# Patient Record
Sex: Female | Born: 1955 | Race: White | Hispanic: No | Marital: Married
Health system: Southern US, Community
[De-identification: ages and names within clinical notes are randomized; demographics above are authoritative.]

## PROBLEM LIST (undated history)

## (undated) DIAGNOSIS — E1169 Type 2 diabetes mellitus with other specified complication: Secondary | ICD-10-CM

## (undated) DIAGNOSIS — E0590 Thyrotoxicosis, unspecified without thyrotoxic crisis or storm: Secondary | ICD-10-CM

## (undated) DIAGNOSIS — I82409 Acute embolism and thrombosis of unspecified deep veins of unspecified lower extremity: Secondary | ICD-10-CM

## (undated) DIAGNOSIS — C541 Malignant neoplasm of endometrium: Secondary | ICD-10-CM

---

## 1997-10-13 ENCOUNTER — Ambulatory Visit (HOSPITAL_COMMUNITY): Admission: RE | Admit: 1997-10-13 | Discharge: 1997-10-13 | Payer: Self-pay

## 2002-08-15 ENCOUNTER — Encounter: Payer: Self-pay | Admitting: Occupational Medicine

## 2002-08-15 ENCOUNTER — Encounter: Admission: RE | Admit: 2002-08-15 | Discharge: 2002-08-15 | Payer: Self-pay | Admitting: Occupational Medicine

## 2019-12-21 DIAGNOSIS — F319 Bipolar disorder, unspecified: Secondary | ICD-10-CM | POA: Diagnosis not present

## 2019-12-21 DIAGNOSIS — I700 Atherosclerosis of aorta: Secondary | ICD-10-CM | POA: Diagnosis not present

## 2019-12-21 DIAGNOSIS — E0590 Thyrotoxicosis, unspecified without thyrotoxic crisis or storm: Secondary | ICD-10-CM | POA: Diagnosis not present

## 2019-12-21 DIAGNOSIS — E1165 Type 2 diabetes mellitus with hyperglycemia: Secondary | ICD-10-CM | POA: Diagnosis not present

## 2019-12-21 DIAGNOSIS — R42 Dizziness and giddiness: Secondary | ICD-10-CM | POA: Diagnosis not present

## 2019-12-21 DIAGNOSIS — E785 Hyperlipidemia, unspecified: Secondary | ICD-10-CM | POA: Diagnosis not present

## 2019-12-21 DIAGNOSIS — Z6841 Body Mass Index (BMI) 40.0 and over, adult: Secondary | ICD-10-CM | POA: Diagnosis not present

## 2020-02-28 DIAGNOSIS — R42 Dizziness and giddiness: Secondary | ICD-10-CM | POA: Diagnosis not present

## 2020-02-28 DIAGNOSIS — Z6839 Body mass index (BMI) 39.0-39.9, adult: Secondary | ICD-10-CM | POA: Diagnosis not present

## 2020-02-28 DIAGNOSIS — N950 Postmenopausal bleeding: Secondary | ICD-10-CM | POA: Diagnosis not present

## 2020-03-08 DIAGNOSIS — I700 Atherosclerosis of aorta: Secondary | ICD-10-CM | POA: Diagnosis not present

## 2020-03-08 DIAGNOSIS — R102 Pelvic and perineal pain: Secondary | ICD-10-CM | POA: Diagnosis not present

## 2020-03-08 DIAGNOSIS — N852 Hypertrophy of uterus: Secondary | ICD-10-CM | POA: Diagnosis not present

## 2020-03-08 DIAGNOSIS — N938 Other specified abnormal uterine and vaginal bleeding: Secondary | ICD-10-CM | POA: Diagnosis not present

## 2020-03-08 DIAGNOSIS — N939 Abnormal uterine and vaginal bleeding, unspecified: Secondary | ICD-10-CM | POA: Diagnosis not present

## 2020-03-08 DIAGNOSIS — K760 Fatty (change of) liver, not elsewhere classified: Secondary | ICD-10-CM | POA: Diagnosis not present

## 2020-03-15 DIAGNOSIS — Z0000 Encounter for general adult medical examination without abnormal findings: Secondary | ICD-10-CM | POA: Diagnosis not present

## 2020-03-15 DIAGNOSIS — E669 Obesity, unspecified: Secondary | ICD-10-CM | POA: Diagnosis not present

## 2020-03-15 DIAGNOSIS — Z1331 Encounter for screening for depression: Secondary | ICD-10-CM | POA: Diagnosis not present

## 2020-03-15 DIAGNOSIS — E785 Hyperlipidemia, unspecified: Secondary | ICD-10-CM | POA: Diagnosis not present

## 2020-03-15 DIAGNOSIS — Z9181 History of falling: Secondary | ICD-10-CM | POA: Diagnosis not present

## 2020-03-21 DIAGNOSIS — N950 Postmenopausal bleeding: Secondary | ICD-10-CM | POA: Diagnosis not present

## 2020-04-09 DIAGNOSIS — N950 Postmenopausal bleeding: Secondary | ICD-10-CM | POA: Diagnosis not present

## 2020-04-23 DIAGNOSIS — F319 Bipolar disorder, unspecified: Secondary | ICD-10-CM | POA: Diagnosis not present

## 2020-04-23 DIAGNOSIS — E785 Hyperlipidemia, unspecified: Secondary | ICD-10-CM | POA: Diagnosis not present

## 2020-04-23 DIAGNOSIS — I479 Paroxysmal tachycardia, unspecified: Secondary | ICD-10-CM | POA: Diagnosis not present

## 2020-04-23 DIAGNOSIS — E0590 Thyrotoxicosis, unspecified without thyrotoxic crisis or storm: Secondary | ICD-10-CM | POA: Diagnosis not present

## 2020-04-23 DIAGNOSIS — I700 Atherosclerosis of aorta: Secondary | ICD-10-CM | POA: Diagnosis not present

## 2020-04-23 DIAGNOSIS — E1165 Type 2 diabetes mellitus with hyperglycemia: Secondary | ICD-10-CM | POA: Diagnosis not present

## 2020-05-23 DIAGNOSIS — N950 Postmenopausal bleeding: Secondary | ICD-10-CM | POA: Diagnosis not present

## 2020-06-07 DIAGNOSIS — I10 Essential (primary) hypertension: Secondary | ICD-10-CM | POA: Diagnosis not present

## 2020-06-07 DIAGNOSIS — E785 Hyperlipidemia, unspecified: Secondary | ICD-10-CM | POA: Diagnosis not present

## 2020-06-07 DIAGNOSIS — Z5331 Laparoscopic surgical procedure converted to open procedure: Secondary | ICD-10-CM | POA: Diagnosis not present

## 2020-06-07 DIAGNOSIS — N950 Postmenopausal bleeding: Secondary | ICD-10-CM | POA: Diagnosis not present

## 2020-06-07 DIAGNOSIS — I700 Atherosclerosis of aorta: Secondary | ICD-10-CM | POA: Diagnosis not present

## 2020-06-07 DIAGNOSIS — D62 Acute posthemorrhagic anemia: Secondary | ICD-10-CM | POA: Diagnosis not present

## 2020-06-07 DIAGNOSIS — F319 Bipolar disorder, unspecified: Secondary | ICD-10-CM | POA: Diagnosis not present

## 2020-06-07 DIAGNOSIS — E119 Type 2 diabetes mellitus without complications: Secondary | ICD-10-CM | POA: Diagnosis not present

## 2020-06-07 DIAGNOSIS — C541 Malignant neoplasm of endometrium: Secondary | ICD-10-CM | POA: Diagnosis not present

## 2020-06-07 DIAGNOSIS — N9961 Intraoperative hemorrhage and hematoma of a genitourinary system organ or structure complicating a genitourinary system procedure: Secondary | ICD-10-CM | POA: Diagnosis not present

## 2020-06-07 DIAGNOSIS — D259 Leiomyoma of uterus, unspecified: Secondary | ICD-10-CM | POA: Diagnosis not present

## 2020-06-26 DIAGNOSIS — Z7689 Persons encountering health services in other specified circumstances: Secondary | ICD-10-CM | POA: Diagnosis not present

## 2020-06-26 DIAGNOSIS — D62 Acute posthemorrhagic anemia: Secondary | ICD-10-CM | POA: Diagnosis not present

## 2020-06-26 DIAGNOSIS — Z6835 Body mass index (BMI) 35.0-35.9, adult: Secondary | ICD-10-CM | POA: Diagnosis not present

## 2020-06-26 DIAGNOSIS — C541 Malignant neoplasm of endometrium: Secondary | ICD-10-CM | POA: Diagnosis not present

## 2020-06-26 DIAGNOSIS — R42 Dizziness and giddiness: Secondary | ICD-10-CM | POA: Diagnosis not present

## 2020-06-26 DIAGNOSIS — Z79899 Other long term (current) drug therapy: Secondary | ICD-10-CM | POA: Diagnosis not present

## 2020-07-26 DIAGNOSIS — C541 Malignant neoplasm of endometrium: Secondary | ICD-10-CM | POA: Diagnosis not present

## 2020-07-26 DIAGNOSIS — I10 Essential (primary) hypertension: Secondary | ICD-10-CM | POA: Diagnosis not present

## 2020-07-26 DIAGNOSIS — Z6835 Body mass index (BMI) 35.0-35.9, adult: Secondary | ICD-10-CM | POA: Diagnosis not present

## 2020-07-26 DIAGNOSIS — R809 Proteinuria, unspecified: Secondary | ICD-10-CM | POA: Diagnosis not present

## 2020-07-26 DIAGNOSIS — Z23 Encounter for immunization: Secondary | ICD-10-CM | POA: Diagnosis not present

## 2020-07-26 DIAGNOSIS — E785 Hyperlipidemia, unspecified: Secondary | ICD-10-CM | POA: Diagnosis not present

## 2020-07-26 DIAGNOSIS — E1129 Type 2 diabetes mellitus with other diabetic kidney complication: Secondary | ICD-10-CM | POA: Diagnosis not present

## 2020-07-26 DIAGNOSIS — D649 Anemia, unspecified: Secondary | ICD-10-CM | POA: Diagnosis not present

## 2020-08-17 DIAGNOSIS — C541 Malignant neoplasm of endometrium: Secondary | ICD-10-CM | POA: Diagnosis not present

## 2020-08-17 DIAGNOSIS — Z888 Allergy status to other drugs, medicaments and biological substances status: Secondary | ICD-10-CM | POA: Diagnosis not present

## 2020-08-17 DIAGNOSIS — Z885 Allergy status to narcotic agent status: Secondary | ICD-10-CM | POA: Diagnosis not present

## 2020-08-23 DIAGNOSIS — C541 Malignant neoplasm of endometrium: Secondary | ICD-10-CM | POA: Diagnosis not present

## 2020-08-31 DIAGNOSIS — E041 Nontoxic single thyroid nodule: Secondary | ICD-10-CM | POA: Diagnosis not present

## 2020-08-31 DIAGNOSIS — C541 Malignant neoplasm of endometrium: Secondary | ICD-10-CM | POA: Diagnosis not present

## 2020-09-05 DIAGNOSIS — Z90710 Acquired absence of both cervix and uterus: Secondary | ICD-10-CM | POA: Diagnosis not present

## 2020-09-05 DIAGNOSIS — Z90722 Acquired absence of ovaries, bilateral: Secondary | ICD-10-CM | POA: Diagnosis not present

## 2020-09-05 DIAGNOSIS — C541 Malignant neoplasm of endometrium: Secondary | ICD-10-CM | POA: Diagnosis not present

## 2020-09-10 DIAGNOSIS — Z452 Encounter for adjustment and management of vascular access device: Secondary | ICD-10-CM | POA: Diagnosis not present

## 2020-09-10 DIAGNOSIS — C541 Malignant neoplasm of endometrium: Secondary | ICD-10-CM | POA: Diagnosis not present

## 2020-09-12 DIAGNOSIS — Z5111 Encounter for antineoplastic chemotherapy: Secondary | ICD-10-CM | POA: Diagnosis not present

## 2020-09-12 DIAGNOSIS — C541 Malignant neoplasm of endometrium: Secondary | ICD-10-CM | POA: Diagnosis not present

## 2020-09-19 DIAGNOSIS — C541 Malignant neoplasm of endometrium: Secondary | ICD-10-CM | POA: Diagnosis not present

## 2020-09-26 DIAGNOSIS — I749 Embolism and thrombosis of unspecified artery: Secondary | ICD-10-CM | POA: Diagnosis not present

## 2020-09-26 DIAGNOSIS — I748 Embolism and thrombosis of other arteries: Secondary | ICD-10-CM | POA: Diagnosis not present

## 2020-09-26 DIAGNOSIS — Z7901 Long term (current) use of anticoagulants: Secondary | ICD-10-CM | POA: Diagnosis not present

## 2020-09-26 DIAGNOSIS — R5383 Other fatigue: Secondary | ICD-10-CM | POA: Diagnosis not present

## 2020-09-26 DIAGNOSIS — C541 Malignant neoplasm of endometrium: Secondary | ICD-10-CM | POA: Diagnosis not present

## 2020-10-03 DIAGNOSIS — I82409 Acute embolism and thrombosis of unspecified deep veins of unspecified lower extremity: Secondary | ICD-10-CM | POA: Diagnosis not present

## 2020-10-03 DIAGNOSIS — Z86718 Personal history of other venous thrombosis and embolism: Secondary | ICD-10-CM | POA: Diagnosis not present

## 2020-10-03 DIAGNOSIS — Z5111 Encounter for antineoplastic chemotherapy: Secondary | ICD-10-CM | POA: Diagnosis not present

## 2020-10-03 DIAGNOSIS — Z79899 Other long term (current) drug therapy: Secondary | ICD-10-CM | POA: Diagnosis not present

## 2020-10-03 DIAGNOSIS — Z7901 Long term (current) use of anticoagulants: Secondary | ICD-10-CM | POA: Diagnosis not present

## 2020-10-03 DIAGNOSIS — C541 Malignant neoplasm of endometrium: Secondary | ICD-10-CM | POA: Diagnosis not present

## 2020-10-24 DIAGNOSIS — Z79899 Other long term (current) drug therapy: Secondary | ICD-10-CM | POA: Diagnosis not present

## 2020-10-24 DIAGNOSIS — C541 Malignant neoplasm of endometrium: Secondary | ICD-10-CM | POA: Diagnosis not present

## 2020-10-24 DIAGNOSIS — I82409 Acute embolism and thrombosis of unspecified deep veins of unspecified lower extremity: Secondary | ICD-10-CM | POA: Diagnosis not present

## 2020-10-24 DIAGNOSIS — Z7901 Long term (current) use of anticoagulants: Secondary | ICD-10-CM | POA: Diagnosis not present

## 2020-10-24 DIAGNOSIS — Z5111 Encounter for antineoplastic chemotherapy: Secondary | ICD-10-CM | POA: Diagnosis not present

## 2020-11-14 DIAGNOSIS — C541 Malignant neoplasm of endometrium: Secondary | ICD-10-CM | POA: Diagnosis not present

## 2020-11-14 DIAGNOSIS — Z5111 Encounter for antineoplastic chemotherapy: Secondary | ICD-10-CM | POA: Diagnosis not present

## 2020-11-14 DIAGNOSIS — Z79899 Other long term (current) drug therapy: Secondary | ICD-10-CM | POA: Diagnosis not present

## 2020-11-14 DIAGNOSIS — Z7901 Long term (current) use of anticoagulants: Secondary | ICD-10-CM | POA: Diagnosis not present

## 2020-11-14 DIAGNOSIS — Z86718 Personal history of other venous thrombosis and embolism: Secondary | ICD-10-CM | POA: Diagnosis not present

## 2020-11-14 DIAGNOSIS — N950 Postmenopausal bleeding: Secondary | ICD-10-CM | POA: Diagnosis not present

## 2020-11-21 DIAGNOSIS — C541 Malignant neoplasm of endometrium: Secondary | ICD-10-CM | POA: Diagnosis not present

## 2020-11-21 DIAGNOSIS — R5383 Other fatigue: Secondary | ICD-10-CM | POA: Diagnosis not present

## 2020-11-21 DIAGNOSIS — R42 Dizziness and giddiness: Secondary | ICD-10-CM | POA: Diagnosis not present

## 2020-11-21 DIAGNOSIS — R55 Syncope and collapse: Secondary | ICD-10-CM | POA: Diagnosis not present

## 2020-11-21 DIAGNOSIS — D709 Neutropenia, unspecified: Secondary | ICD-10-CM | POA: Diagnosis not present

## 2020-11-21 DIAGNOSIS — R112 Nausea with vomiting, unspecified: Secondary | ICD-10-CM | POA: Diagnosis not present

## 2020-11-27 DIAGNOSIS — E785 Hyperlipidemia, unspecified: Secondary | ICD-10-CM | POA: Diagnosis not present

## 2020-11-27 DIAGNOSIS — E1165 Type 2 diabetes mellitus with hyperglycemia: Secondary | ICD-10-CM | POA: Diagnosis not present

## 2020-11-27 DIAGNOSIS — Z6835 Body mass index (BMI) 35.0-35.9, adult: Secondary | ICD-10-CM | POA: Diagnosis not present

## 2020-11-27 DIAGNOSIS — I10 Essential (primary) hypertension: Secondary | ICD-10-CM | POA: Diagnosis not present

## 2020-11-27 DIAGNOSIS — E0590 Thyrotoxicosis, unspecified without thyrotoxic crisis or storm: Secondary | ICD-10-CM | POA: Diagnosis not present

## 2020-11-27 DIAGNOSIS — Z23 Encounter for immunization: Secondary | ICD-10-CM | POA: Diagnosis not present

## 2020-11-30 DIAGNOSIS — Z7901 Long term (current) use of anticoagulants: Secondary | ICD-10-CM | POA: Diagnosis not present

## 2020-11-30 DIAGNOSIS — G629 Polyneuropathy, unspecified: Secondary | ICD-10-CM | POA: Diagnosis not present

## 2020-11-30 DIAGNOSIS — C541 Malignant neoplasm of endometrium: Secondary | ICD-10-CM | POA: Diagnosis not present

## 2020-11-30 DIAGNOSIS — I82409 Acute embolism and thrombosis of unspecified deep veins of unspecified lower extremity: Secondary | ICD-10-CM | POA: Diagnosis not present

## 2020-12-05 DIAGNOSIS — C541 Malignant neoplasm of endometrium: Secondary | ICD-10-CM | POA: Diagnosis not present

## 2020-12-05 DIAGNOSIS — Z5111 Encounter for antineoplastic chemotherapy: Secondary | ICD-10-CM | POA: Diagnosis not present

## 2020-12-26 DIAGNOSIS — C541 Malignant neoplasm of endometrium: Secondary | ICD-10-CM | POA: Diagnosis not present

## 2020-12-26 DIAGNOSIS — Z5111 Encounter for antineoplastic chemotherapy: Secondary | ICD-10-CM | POA: Diagnosis not present

## 2020-12-26 DIAGNOSIS — Z86718 Personal history of other venous thrombosis and embolism: Secondary | ICD-10-CM | POA: Diagnosis not present

## 2020-12-26 DIAGNOSIS — Z79899 Other long term (current) drug therapy: Secondary | ICD-10-CM | POA: Diagnosis not present

## 2020-12-26 DIAGNOSIS — G629 Polyneuropathy, unspecified: Secondary | ICD-10-CM | POA: Diagnosis not present

## 2020-12-26 DIAGNOSIS — Z7901 Long term (current) use of anticoagulants: Secondary | ICD-10-CM | POA: Diagnosis not present

## 2021-01-24 ENCOUNTER — Emergency Department (HOSPITAL_COMMUNITY): Payer: Medicare HMO

## 2021-01-24 ENCOUNTER — Inpatient Hospital Stay (HOSPITAL_COMMUNITY): Payer: Medicare HMO

## 2021-01-24 ENCOUNTER — Inpatient Hospital Stay (HOSPITAL_COMMUNITY)
Admission: EM | Admit: 2021-01-24 | Discharge: 2021-01-27 | DRG: 682 | Disposition: A | Discharge status: Home / Self Care | Payer: Medicare HMO | Attending: Internal Medicine | Admitting: Internal Medicine

## 2021-01-24 ENCOUNTER — Encounter (HOSPITAL_COMMUNITY): Payer: Self-pay | Admitting: Internal Medicine

## 2021-01-24 ENCOUNTER — Other Ambulatory Visit: Payer: Self-pay

## 2021-01-24 DIAGNOSIS — N179 Acute kidney failure, unspecified: Secondary | ICD-10-CM | POA: Diagnosis not present

## 2021-01-24 DIAGNOSIS — F419 Anxiety disorder, unspecified: Secondary | ICD-10-CM | POA: Diagnosis present

## 2021-01-24 DIAGNOSIS — Z20822 Contact with and (suspected) exposure to covid-19: Secondary | ICD-10-CM | POA: Diagnosis present

## 2021-01-24 DIAGNOSIS — E119 Type 2 diabetes mellitus without complications: Secondary | ICD-10-CM | POA: Diagnosis present

## 2021-01-24 DIAGNOSIS — Z86718 Personal history of other venous thrombosis and embolism: Secondary | ICD-10-CM | POA: Diagnosis not present

## 2021-01-24 DIAGNOSIS — D539 Nutritional anemia, unspecified: Secondary | ICD-10-CM | POA: Diagnosis not present

## 2021-01-24 DIAGNOSIS — E0590 Thyrotoxicosis, unspecified without thyrotoxic crisis or storm: Secondary | ICD-10-CM | POA: Diagnosis present

## 2021-01-24 DIAGNOSIS — E860 Dehydration: Secondary | ICD-10-CM | POA: Diagnosis present

## 2021-01-24 DIAGNOSIS — R9431 Abnormal electrocardiogram [ECG] [EKG]: Secondary | ICD-10-CM | POA: Diagnosis not present

## 2021-01-24 DIAGNOSIS — R410 Disorientation, unspecified: Secondary | ICD-10-CM | POA: Diagnosis not present

## 2021-01-24 DIAGNOSIS — R0602 Shortness of breath: Secondary | ICD-10-CM | POA: Diagnosis not present

## 2021-01-24 DIAGNOSIS — R112 Nausea with vomiting, unspecified: Secondary | ICD-10-CM | POA: Diagnosis not present

## 2021-01-24 DIAGNOSIS — Z7901 Long term (current) use of anticoagulants: Secondary | ICD-10-CM

## 2021-01-24 DIAGNOSIS — Z9221 Personal history of antineoplastic chemotherapy: Secondary | ICD-10-CM

## 2021-01-24 DIAGNOSIS — E875 Hyperkalemia: Secondary | ICD-10-CM

## 2021-01-24 DIAGNOSIS — Z888 Allergy status to other drugs, medicaments and biological substances status: Secondary | ICD-10-CM

## 2021-01-24 DIAGNOSIS — Z6836 Body mass index (BMI) 36.0-36.9, adult: Secondary | ICD-10-CM

## 2021-01-24 DIAGNOSIS — R58 Hemorrhage, not elsewhere classified: Secondary | ICD-10-CM | POA: Diagnosis not present

## 2021-01-24 DIAGNOSIS — E669 Obesity, unspecified: Secondary | ICD-10-CM | POA: Diagnosis present

## 2021-01-24 DIAGNOSIS — K5909 Other constipation: Secondary | ICD-10-CM | POA: Diagnosis present

## 2021-01-24 DIAGNOSIS — I9589 Other hypotension: Secondary | ICD-10-CM | POA: Diagnosis not present

## 2021-01-24 DIAGNOSIS — G9341 Metabolic encephalopathy: Secondary | ICD-10-CM | POA: Diagnosis not present

## 2021-01-24 DIAGNOSIS — R1111 Vomiting without nausea: Secondary | ICD-10-CM | POA: Diagnosis not present

## 2021-01-24 DIAGNOSIS — R3129 Other microscopic hematuria: Secondary | ICD-10-CM | POA: Diagnosis present

## 2021-01-24 DIAGNOSIS — Z91018 Allergy to other foods: Secondary | ICD-10-CM

## 2021-01-24 DIAGNOSIS — N1330 Unspecified hydronephrosis: Secondary | ICD-10-CM | POA: Diagnosis not present

## 2021-01-24 DIAGNOSIS — F32A Depression, unspecified: Secondary | ICD-10-CM | POA: Diagnosis present

## 2021-01-24 DIAGNOSIS — D849 Immunodeficiency, unspecified: Secondary | ICD-10-CM | POA: Diagnosis not present

## 2021-01-24 DIAGNOSIS — I959 Hypotension, unspecified: Secondary | ICD-10-CM

## 2021-01-24 DIAGNOSIS — R0902 Hypoxemia: Secondary | ICD-10-CM | POA: Diagnosis not present

## 2021-01-24 DIAGNOSIS — D696 Thrombocytopenia, unspecified: Secondary | ICD-10-CM | POA: Diagnosis not present

## 2021-01-24 DIAGNOSIS — D638 Anemia in other chronic diseases classified elsewhere: Secondary | ICD-10-CM | POA: Diagnosis not present

## 2021-01-24 DIAGNOSIS — E872 Acidosis, unspecified: Secondary | ICD-10-CM | POA: Diagnosis not present

## 2021-01-24 DIAGNOSIS — I2782 Chronic pulmonary embolism: Secondary | ICD-10-CM | POA: Diagnosis not present

## 2021-01-24 DIAGNOSIS — R7401 Elevation of levels of liver transaminase levels: Secondary | ICD-10-CM | POA: Diagnosis present

## 2021-01-24 DIAGNOSIS — E861 Hypovolemia: Secondary | ICD-10-CM | POA: Diagnosis present

## 2021-01-24 DIAGNOSIS — E118 Type 2 diabetes mellitus with unspecified complications: Secondary | ICD-10-CM

## 2021-01-24 DIAGNOSIS — E785 Hyperlipidemia, unspecified: Secondary | ICD-10-CM | POA: Diagnosis present

## 2021-01-24 DIAGNOSIS — C541 Malignant neoplasm of endometrium: Secondary | ICD-10-CM | POA: Diagnosis present

## 2021-01-24 DIAGNOSIS — T464X5A Adverse effect of angiotensin-converting-enzyme inhibitors, initial encounter: Secondary | ICD-10-CM | POA: Diagnosis present

## 2021-01-24 DIAGNOSIS — Z885 Allergy status to narcotic agent status: Secondary | ICD-10-CM

## 2021-01-24 DIAGNOSIS — Z7985 Long-term (current) use of injectable non-insulin antidiabetic drugs: Secondary | ICD-10-CM

## 2021-01-24 DIAGNOSIS — Z79899 Other long term (current) drug therapy: Secondary | ICD-10-CM

## 2021-01-24 HISTORY — DX: Malignant neoplasm of endometrium: C54.1

## 2021-01-24 HISTORY — DX: Type 2 diabetes mellitus with other specified complication: E11.69

## 2021-01-24 HISTORY — DX: Acute embolism and thrombosis of unspecified deep veins of unspecified lower extremity: I82.409

## 2021-01-24 HISTORY — DX: Thyrotoxicosis, unspecified without thyrotoxic crisis or storm: E05.90

## 2021-01-24 LAB — RENAL FUNCTION PANEL
Albumin: 3.3 g/dL — ABNORMAL LOW (ref 3.5–5.0)
Anion gap: 17 — ABNORMAL HIGH (ref 5–15)
BUN: 27 mg/dL — ABNORMAL HIGH (ref 8–23)
CO2: 20 mmol/L — ABNORMAL LOW (ref 22–32)
Calcium: 8.6 mg/dL — ABNORMAL LOW (ref 8.9–10.3)
Chloride: 105 mmol/L (ref 98–111)
Creatinine, Ser: 3.08 mg/dL — ABNORMAL HIGH (ref 0.44–1.00)
GFR, Estimated: 16 mL/min — ABNORMAL LOW (ref >60)
Glucose, Bld: 212 mg/dL — ABNORMAL HIGH (ref 70–99)
Phosphorus: 4.3 mg/dL (ref 2.5–4.6)
Potassium: 4.8 mmol/L (ref 3.5–5.1)
Sodium: 142 mmol/L (ref 135–145)

## 2021-01-24 LAB — RAPID URINE DRUG SCREEN, HOSP PERFORMED
Amphetamines: NOT DETECTED
Barbiturates: NOT DETECTED
Benzodiazepines: NOT DETECTED
Cocaine: NOT DETECTED
Opiates: NOT DETECTED
Tetrahydrocannabinol: NOT DETECTED

## 2021-01-24 LAB — URINALYSIS, ROUTINE W REFLEX MICROSCOPIC
Bilirubin Urine: NEGATIVE
Glucose, UA: 250 mg/dL — AB
Ketones, ur: NEGATIVE mg/dL
Nitrite: NEGATIVE
Protein, ur: 30 mg/dL — AB
Specific Gravity, Urine: 1.015 (ref 1.005–1.030)
pH: 7 (ref 5.0–8.0)

## 2021-01-24 LAB — CBC WITH DIFFERENTIAL/PLATELET
Abs Immature Granulocytes: 0.06 10*3/uL (ref 0.00–0.07)
Basophils Absolute: 0 10*3/uL (ref 0.0–0.1)
Basophils Relative: 0 %
Eosinophils Absolute: 0 10*3/uL (ref 0.0–0.5)
Eosinophils Relative: 0 %
HCT: 24.5 % — ABNORMAL LOW (ref 36.0–46.0)
Hemoglobin: 7.6 g/dL — ABNORMAL LOW (ref 12.0–15.0)
Immature Granulocytes: 1 %
Lymphocytes Relative: 35 %
Lymphs Abs: 3.4 10*3/uL (ref 0.7–4.0)
MCH: 38.6 pg — ABNORMAL HIGH (ref 26.0–34.0)
MCHC: 31 g/dL (ref 30.0–36.0)
MCV: 124.4 fL — ABNORMAL HIGH (ref 80.0–100.0)
Monocytes Absolute: 0.9 10*3/uL (ref 0.1–1.0)
Monocytes Relative: 9 %
Neutro Abs: 5.2 10*3/uL (ref 1.7–7.7)
Neutrophils Relative %: 55 %
Platelets: 155 10*3/uL (ref 150–400)
RBC: 1.97 MIL/uL — ABNORMAL LOW (ref 3.87–5.11)
RDW: 21.9 % — ABNORMAL HIGH (ref 11.5–15.5)
WBC: 9.6 10*3/uL (ref 4.0–10.5)
nRBC: 0.5 % — ABNORMAL HIGH (ref 0.0–0.2)

## 2021-01-24 LAB — PROTIME-INR
INR: 1.4 — ABNORMAL HIGH (ref 0.8–1.2)
Prothrombin Time: 17 seconds — ABNORMAL HIGH (ref 11.4–15.2)

## 2021-01-24 LAB — TROPONIN I (HIGH SENSITIVITY)
Troponin I (High Sensitivity): 11 ng/L (ref <18)
Troponin I (High Sensitivity): 14 ng/L (ref <18)

## 2021-01-24 LAB — LACTIC ACID, PLASMA
Lactic Acid, Venous: 5.5 mmol/L (ref 0.5–1.9)
Lactic Acid, Venous: 7 mmol/L (ref 0.5–1.9)
Lactic Acid, Venous: 3.7 mmol/L (ref 0.5–1.9)
Lactic Acid, Venous: 3.3 mmol/L (ref 0.5–1.9)

## 2021-01-24 LAB — AMMONIA: Ammonia: 12 umol/L (ref 9–35)

## 2021-01-24 LAB — COMPREHENSIVE METABOLIC PANEL
ALT: 21 U/L (ref 0–44)
AST: 42 U/L — ABNORMAL HIGH (ref 15–41)
Albumin: 3.6 g/dL (ref 3.5–5.0)
Alkaline Phosphatase: 54 U/L (ref 38–126)
Anion gap: 17 — ABNORMAL HIGH (ref 5–15)
BUN: 30 mg/dL — ABNORMAL HIGH (ref 8–23)
CO2: 20 mmol/L — ABNORMAL LOW (ref 22–32)
Calcium: 9.3 mg/dL (ref 8.9–10.3)
Chloride: 102 mmol/L (ref 98–111)
Creatinine, Ser: 3.55 mg/dL — ABNORMAL HIGH (ref 0.44–1.00)
GFR, Estimated: 14 mL/min — ABNORMAL LOW (ref >60)
Glucose, Bld: 182 mg/dL — ABNORMAL HIGH (ref 70–99)
Potassium: 7 mmol/L (ref 3.5–5.1)
Sodium: 139 mmol/L (ref 135–145)
Total Bilirubin: 0.5 mg/dL (ref 0.3–1.2)
Total Protein: 6.8 g/dL (ref 6.5–8.1)

## 2021-01-24 LAB — RESP PANEL BY RT-PCR (FLU A&B, COVID) ARPGX2
Influenza A by PCR: NEGATIVE
Influenza B by PCR: NEGATIVE
SARS Coronavirus 2 by RT PCR: NEGATIVE

## 2021-01-24 LAB — TSH: TSH: 8.388 u[IU]/mL — ABNORMAL HIGH (ref 0.350–4.500)

## 2021-01-24 LAB — URINALYSIS, MICROSCOPIC (REFLEX): RBC / HPF: >50 RBC/hpf (ref 0–5)

## 2021-01-24 LAB — LIPASE, BLOOD: Lipase: 29 U/L (ref 11–51)

## 2021-01-24 LAB — CBG MONITORING, ED
Glucose-Capillary: 197 mg/dL — ABNORMAL HIGH (ref 70–99)
Glucose-Capillary: 110 mg/dL — ABNORMAL HIGH (ref 70–99)
Glucose-Capillary: 122 mg/dL — ABNORMAL HIGH (ref 70–99)

## 2021-01-24 LAB — ETHANOL: Alcohol, Ethyl (B): <10 mg/dL (ref <10)

## 2021-01-24 LAB — ABO/RH: ABO/RH(D): A POS

## 2021-01-24 LAB — PREPARE RBC (CROSSMATCH)

## 2021-01-24 LAB — HEMOGLOBIN AND HEMATOCRIT, BLOOD
HCT: 20.4 % — ABNORMAL LOW (ref 36.0–46.0)
HCT: 23.9 % — ABNORMAL LOW (ref 36.0–46.0)
Hemoglobin: 6.7 g/dL — CL (ref 12.0–15.0)
Hemoglobin: 7.4 g/dL — ABNORMAL LOW (ref 12.0–15.0)

## 2021-01-24 LAB — T4, FREE: Free T4: 0.63 ng/dL (ref 0.61–1.12)

## 2021-01-24 LAB — MAGNESIUM: Magnesium: 1.9 mg/dL (ref 1.7–2.4)

## 2021-01-24 LAB — APTT: aPTT: 27 seconds (ref 24–36)

## 2021-01-24 LAB — URIC ACID: Uric Acid, Serum: 8 mg/dL — ABNORMAL HIGH (ref 2.5–7.1)

## 2021-01-24 MED ORDER — VANCOMYCIN HCL 1500 MG/300ML IV SOLN
1500.0000 mg | Freq: Once | INTRAVENOUS | Status: AC
  Administered 2021-01-24: 1500 mg via INTRAVENOUS
  Filled 2021-01-24: qty 300

## 2021-01-24 MED ORDER — DEXTROSE 50 % IV SOLN
1.0000 | Freq: Once | INTRAVENOUS | Status: AC
  Administered 2021-01-24: 50 mL via INTRAVENOUS
  Filled 2021-01-24: qty 50

## 2021-01-24 MED ORDER — SODIUM CHLORIDE 0.9 % IV SOLN
2.0000 g | Freq: Once | INTRAVENOUS | Status: AC
  Administered 2021-01-24: 2 g via INTRAVENOUS
  Filled 2021-01-24: qty 2

## 2021-01-24 MED ORDER — CHLORHEXIDINE GLUCONATE CLOTH 2 % EX PADS
6.0000 | MEDICATED_PAD | Freq: Every day | CUTANEOUS | Status: DC
  Administered 2021-01-24 – 2021-01-27 (×4): 6 via TOPICAL

## 2021-01-24 MED ORDER — SODIUM BICARBONATE 8.4 % IV SOLN
50.0000 meq | Freq: Once | INTRAVENOUS | Status: AC
  Administered 2021-01-24: 50 meq via INTRAVENOUS
  Filled 2021-01-24: qty 50

## 2021-01-24 MED ORDER — INSULIN ASPART 100 UNIT/ML IV SOLN
5.0000 [IU] | Freq: Once | INTRAVENOUS | Status: AC
  Administered 2021-01-24: 5 [IU] via INTRAVENOUS

## 2021-01-24 MED ORDER — ONDANSETRON HCL 4 MG/2ML IJ SOLN: 4.0000 mg | Freq: Four times a day (QID) | INTRAMUSCULAR | Status: DC | PRN

## 2021-01-24 MED ORDER — ALBUTEROL SULFATE (2.5 MG/3ML) 0.083% IN NEBU: 2.5000 mg | INHALATION_SOLUTION | Freq: Four times a day (QID) | RESPIRATORY_TRACT | Status: DC | PRN

## 2021-01-24 MED ORDER — DEXTROSE 10 % IV SOLN: Freq: Once | INTRAVENOUS | Status: AC

## 2021-01-24 MED ORDER — ACETAMINOPHEN 325 MG PO TABS: 650.0000 mg | ORAL_TABLET | Freq: Four times a day (QID) | ORAL | Status: DC | PRN

## 2021-01-24 MED ORDER — ONDANSETRON HCL 4 MG PO TABS: 4.0000 mg | ORAL_TABLET | Freq: Four times a day (QID) | ORAL | Status: DC | PRN

## 2021-01-24 MED ORDER — SODIUM CHLORIDE 0.9% IV SOLUTION: Freq: Once | INTRAVENOUS | Status: AC

## 2021-01-24 MED ORDER — HEPARIN (PORCINE) 25000 UT/250ML-% IV SOLN: 1200.0000 [IU]/h | INTRAVENOUS | Status: DC

## 2021-01-24 MED ORDER — SODIUM CHLORIDE 0.9% FLUSH
3.0000 mL | Freq: Two times a day (BID) | INTRAVENOUS | Status: DC
  Administered 2021-01-24 – 2021-01-27 (×7): 3 mL via INTRAVENOUS

## 2021-01-24 MED ORDER — SODIUM CHLORIDE 0.9 % IV BOLUS
1000.0000 mL | Freq: Once | INTRAVENOUS | Status: AC
  Administered 2021-01-24: 1000 mL via INTRAVENOUS

## 2021-01-24 MED ORDER — VANCOMYCIN VARIABLE DOSE PER UNSTABLE RENAL FUNCTION (PHARMACIST DOSING): Status: DC

## 2021-01-24 MED ORDER — ACETAMINOPHEN 650 MG RE SUPP: 650.0000 mg | Freq: Four times a day (QID) | RECTAL | Status: DC | PRN

## 2021-01-24 MED ORDER — FUROSEMIDE 10 MG/ML IJ SOLN
40.0000 mg | Freq: Once | INTRAMUSCULAR | Status: AC
  Administered 2021-01-24: 40 mg via INTRAVENOUS
  Filled 2021-01-24: qty 4

## 2021-01-24 MED ORDER — SODIUM CHLORIDE 0.9% FLUSH
10.0000 mL | Freq: Two times a day (BID) | INTRAVENOUS | Status: DC
  Administered 2021-01-24 – 2021-01-25 (×3): 10 mL

## 2021-01-24 MED ORDER — SODIUM CHLORIDE 0.9 % IV SOLN
2.0000 g | INTRAVENOUS | Status: DC
  Administered 2021-01-25: 2 g via INTRAVENOUS
  Filled 2021-01-24: qty 2

## 2021-01-24 MED ORDER — OLANZAPINE 2.5 MG PO TABS
2.5000 mg | ORAL_TABLET | Freq: Every day | ORAL | Status: DC
  Administered 2021-01-24 – 2021-01-26 (×3): 2.5 mg via ORAL
  Filled 2021-01-24 (×4): qty 1

## 2021-01-24 MED ORDER — SODIUM CHLORIDE 0.9% FLUSH: 10.0000 mL | INTRAVENOUS | Status: DC | PRN

## 2021-01-24 MED ORDER — ALBUTEROL SULFATE (2.5 MG/3ML) 0.083% IN NEBU
10.0000 mg | INHALATION_SOLUTION | Freq: Once | RESPIRATORY_TRACT | Status: AC
  Administered 2021-01-24: 10 mg via RESPIRATORY_TRACT
  Filled 2021-01-24: qty 12

## 2021-01-24 MED ORDER — LACTATED RINGERS IV SOLN: INTRAVENOUS | Status: DC

## 2021-01-24 MED ORDER — ONDANSETRON HCL 4 MG/2ML IJ SOLN
4.0000 mg | Freq: Once | INTRAMUSCULAR | Status: AC
  Administered 2021-01-24: 4 mg via INTRAVENOUS
  Filled 2021-01-24: qty 2

## 2021-01-24 MED ORDER — ROSUVASTATIN CALCIUM 5 MG PO TABS
30.0000 mg | ORAL_TABLET | Freq: Every evening | ORAL | Status: DC
  Administered 2021-01-24 – 2021-01-26 (×3): 30 mg via ORAL
  Filled 2021-01-24 (×3): qty 2

## 2021-01-24 MED ORDER — METRONIDAZOLE 500 MG/100ML IV SOLN
500.0000 mg | Freq: Two times a day (BID) | INTRAVENOUS | Status: DC
  Administered 2021-01-24 – 2021-01-25 (×3): 500 mg via INTRAVENOUS
  Filled 2021-01-24 (×3): qty 100

## 2021-01-24 MED ORDER — FLUOXETINE HCL 20 MG PO CAPS
40.0000 mg | ORAL_CAPSULE | Freq: Every day | ORAL | Status: DC
  Administered 2021-01-25 – 2021-01-27 (×3): 40 mg via ORAL
  Filled 2021-01-24 (×3): qty 2

## 2021-01-24 MED ORDER — SODIUM ZIRCONIUM CYCLOSILICATE 10 G PO PACK
10.0000 g | PACK | Freq: Once | ORAL | Status: AC
  Administered 2021-01-24: 10 g via ORAL
  Filled 2021-01-24: qty 1

## 2021-01-24 MED ORDER — DARIFENACIN HYDROBROMIDE ER 15 MG PO TB24
15.0000 mg | ORAL_TABLET | Freq: Every day | ORAL | Status: DC
  Administered 2021-01-25 – 2021-01-27 (×3): 15 mg via ORAL
  Filled 2021-01-24 (×4): qty 1

## 2021-01-24 MED ORDER — INSULIN ASPART 100 UNIT/ML IJ SOLN: 0.0000 [IU] | Freq: Three times a day (TID) | INTRAMUSCULAR | Status: DC

## 2021-01-24 MED ORDER — LACTATED RINGERS IV BOLUS
1000.0000 mL | Freq: Once | INTRAVENOUS | Status: AC
  Administered 2021-01-24: 1000 mL via INTRAVENOUS

## 2021-01-24 MED ORDER — CALCIUM GLUCONATE-NACL 1-0.675 GM/50ML-% IV SOLN
1.0000 g | Freq: Once | INTRAVENOUS | Status: AC
  Administered 2021-01-24: 1000 mg via INTRAVENOUS
  Filled 2021-01-24: qty 50

## 2021-01-24 MED ORDER — SODIUM CHLORIDE 0.9 % IV BOLUS
2000.0000 mL | Freq: Once | INTRAVENOUS | Status: AC
  Administered 2021-01-24: 2000 mL via INTRAVENOUS

## 2021-01-24 NOTE — ED Notes (Signed)
Call to husband Iona Beard to notify of bed available , no answer

## 2021-01-24 NOTE — ED Notes (Signed)
Admitting provider at bedside, initially gave verbal order to hold NS bolus due to stable b/p, upon next b/p measurement, lower reading again, verbal order to start NS bolus as previously ordered

## 2021-01-24 NOTE — Consult Note (Signed)
[facility] Nephrology Consultation Note  Requesting MD: Dr Nanda Quinton ([place]) Reason for consult: AKI, Hyperkalemia  HPI:  Becky Martin is a 66 y.o. female with past medical history of hypertension, DVT on Eliquis, endometrial cancer currently on chemotherapy presented with nausea vomiting and decreased oral intake seen as a consultation for the management of acute kidney injury and hyperkalemia. The patient follows at [facility] for further treatment of endometrial cancer.  She is currently on carboplatin and paclitaxel, last seen by oncology on 12/26/2020.  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  At [facility] the creatinine level was around 0.9-1.13 at baseline.  As per patient's husband, the patient was nauseated and started vomiting around 2 PM yesterday.  Associated with change in mental status and not eating.  Denies hematemesis. In the ER, the blood pressure dropped to 61/47 which was resuscitated with IV fluid.  Labs showed sodium 139, potassium 7, CO2 20, BUN 30, creatinine level 3.55, anion gap 17, lactic acid level 5.5, WBC 9.6, RBC 1.97, hemoglobin 7.6.  Influenza and COVID test negative.  The chest x-ray with no acute finding. She was treated with IV fluid already received around 2 L of IV bolus.  Blood pressure and mental status significantly improved.  For hyperkalemia the patient was treated medically with calcium gluconate, dextrose, insulin, Lasix, Lokelma, sodium bicarb and fluid. She is on Lasix and lisinopril at home. The bladder scan without urinary retention per nursing staff. The patient will be admitted for further evaluation.  She currently denies headache, dizziness, nausea, vomiting, chest pain or shortness of breath.  No urinary complaint.  Her husband at bedside in ER.  Creatinine, Ser  Date/Time Value Ref Range Status  01/24/2021 03:50 AM 3.55 (H) 0.44 - 1.00 mg/dL Final     PMHx: DVT,  hypertension, endometrial cancer  PSH.  History reviewed.  Family Hx: No family history of kidney disease. Social History:  has no history on file for tobacco use, alcohol use, and drug use.  Allergies:  Allergies  Allergen Reactions   Other Hives    Sausage   Atenolol Other (See Comments)    Syncope, bradycardia (occurred in the setting of AKI) Syncope, bradycardia (occurred in the setting of AKI)    Lithium Other (See Comments)    Interacts with other meds that she takes   Metformin Nausea Only   Codeine Itching and Rash   Diazepam Itching and Rash   Fexofenadine Itching and Rash    Medications: Prior to Admission medications   Medication Sig Start Date End Date Taking? Authorizing Provider  ELIQUIS 5 MG TABS tablet Take 5 mg by mouth 2 (two) times daily. 01/14/21  Yes [provider]  FLUoxetine (PROZAC) 40 MG capsule Take 40 mg by mouth daily. 12/22/20  Yes [provider]  furosemide (LASIX) 20 MG tablet Take 20 mg by mouth 2 (two) times daily. 01/16/21  Yes [provider]  ibuprofen (ADVIL) 800 MG tablet Take 800 mg by mouth every 6 (six) hours as needed for mild pain or moderate pain. 06/09/20  Yes [provider]  lisinopril (ZESTRIL) 5 MG tablet Take 5 mg by mouth daily. 01/16/21  Yes [provider]  meclizine (ANTIVERT) 25 MG tablet Take 50 mg by mouth 2 (two) times daily. 01/17/21  Yes [provider]  methimazole (TAPAZOLE) 10 MG tablet Take by mouth. 12/08/20  Yes [provider]  metoprolol succinate (TOPROL-XL) 25 MG 24  hr tablet Take 25 mg by mouth 2 (two) times daily. 11/28/20  Yes [provider]  OLANZapine (ZYPREXA) 2.5 MG tablet Take 2.5 mg by mouth at bedtime. 01/03/21  Yes [provider]  OZEMPIC, 1 MG/DOSE, 4 MG/3ML SOPN Inject 1 mg into the skin once a week. 12/05/20  Yes [provider]  prochlorperazine (COMPAZINE) 10 MG tablet Take 10 mg by mouth every 6 (six) hours as  needed for vomiting or nausea. 09/12/20  Yes [provider]  rosuvastatin (CRESTOR) 20 MG tablet Take by mouth. 12/03/20  Yes [provider]  solifenacin (VESICARE) 10 MG tablet Take 10 mg by mouth daily. 12/10/20  Yes [provider]    I have reviewed the patient's current medications.  Labs:  Results for orders placed or performed during the hospital encounter of 01/24/21 (from the past 48 hour(s))  Lactic acid, plasma     Status: Abnormal   Collection Time: 01/24/21  3:39 AM  Result Value Ref Range   Lactic Acid, Venous 5.5 (HH) 0.5 - 1.9 mmol/L    Comment: CRITICAL RESULT CALLED TO, READ BACK BY AND VERIFIED WITH: Mariel Craft 01/24/21 [place] Performed at [facility] [address], [place],  [address]   Resp Panel by RT-PCR (Flu A&B, Covid) Nasopharyngeal Swab     Status: None   Collection Time: 01/24/21  3:40 AM   Specimen: Nasopharyngeal Swab; Nasopharyngeal(NP) swabs in vial transport medium  Result Value Ref Range   SARS Coronavirus 2 by RT PCR NEGATIVE NEGATIVE    Comment: (NOTE) SARS-CoV-2 target nucleic acids are NOT DETECTED.  The SARS-CoV-2 RNA is generally detectable in upper respiratory specimens during the acute phase of infection. The lowest concentration of SARS-CoV-2 viral copies this assay can detect is 138 copies/mL. A negative result does not preclude SARS-Cov-2 infection and should not be used as the sole basis for treatment or other patient management decisions. A negative result may occur with  improper specimen collection/handling, submission of specimen other than nasopharyngeal swab, presence of viral mutation(s) within the areas targeted by this assay, and inadequate number of viral copies(<138 copies/mL). A negative result must be combined with clinical observations, patient history, and epidemiological information. The expected result is Negative.  Fact Sheet for Patients:   EntrepreneurPulse.com.au  Fact Sheet for Healthcare Providers:  IncredibleEmployment.be  This test is no t yet approved or cleared by the [place] FDA and  has been authorized for detection and/or diagnosis of SARS-CoV-2 by FDA under an Emergency Use Authorization (EUA). This EUA will remain  in effect (meaning this test can be used) for the duration of the COVID-19 declaration under Section 564(b)(1) of the Act, 21 U.S.C.section 360bbb-3(b)(1), unless the authorization is terminated  or revoked sooner.       Influenza A by PCR NEGATIVE NEGATIVE   Influenza B by PCR NEGATIVE NEGATIVE    Comment: (NOTE) The Xpert Xpress SARS-CoV-2/FLU/RSV plus assay is intended as an aid in the diagnosis of influenza from Nasopharyngeal swab specimens and should not be used as a sole basis for treatment. Nasal washings and aspirates are unacceptable for Xpert Xpress SARS-CoV-2/FLU/RSV testing.  Fact Sheet for Patients: EntrepreneurPulse.com.au  Fact Sheet for Healthcare Providers: IncredibleEmployment.be  This test is not yet approved or cleared by the [place] FDA and has been authorized for detection and/or diagnosis of SARS-CoV-2 by FDA under an Emergency Use Authorization (EUA). This EUA will remain in effect (meaning this test can be used) for  the duration of the COVID-19 declaration under Section 564(b)(1) of the Act, 21 U.S.C. section 360bbb-3(b)(1), unless the authorization is terminated or revoked.  Performed at [facility], [place] [address], [place], [place] [address]   Ethanol     Status: None   Collection Time: 01/24/21  3:40 AM  Result Value Ref Range   Alcohol, Ethyl (B) <10 <10 mg/dL    Comment: (NOTE) Lowest detectable limit for serum alcohol is 10 mg/dL.  For medical purposes only. Performed at [facility], [place] [address], [place], [place] [address]   Comprehensive  metabolic panel     Status: Abnormal   Collection Time: 01/24/21  3:50 AM  Result Value Ref Range   Sodium 139 135 - 145 mmol/L   Potassium 7.0 (HH) 3.5 - 5.1 mmol/L    Comment: CRITICAL RESULT CALLED TO, READ BACK BY AND VERIFIED WITH: SMITH L,RN 01/24/21 0446 WAYK    Chloride 102 98 - 111 mmol/L   CO2 20 (L) 22 - 32 mmol/L   Glucose, Bld 182 (H) 70 - 99 mg/dL    Comment: Glucose reference range applies only to samples taken after fasting for at least 8 hours.   BUN 30 (H) 8 - 23 mg/dL   Creatinine, Ser 3.55 (H) 0.44 - 1.00 mg/dL   Calcium 9.3 8.9 - 10.3 mg/dL   Total Protein 6.8 6.5 - 8.1 g/dL   Albumin 3.6 3.5 - 5.0 g/dL   AST 42 (H) 15 - 41 U/L   ALT 21 0 - 44 U/L   Alkaline Phosphatase 54 38 - 126 U/L   Total Bilirubin 0.5 0.3 - 1.2 mg/dL   GFR, Estimated 14 (L) >60 mL/min    Comment: (NOTE) Calculated using the CKD-EPI Creatinine Equation (2021)    Anion gap 17 (H) 5 - 15    Comment: Performed at [facility], [place] [address] [place], [place] [address]  Lipase, blood     Status: None   Collection Time: 01/24/21  3:50 AM  Result Value Ref Range   Lipase 29 11 - 51 U/L    Comment: Performed at [facility] [address], [place], [place] [address]  CBC with Differential     Status: Abnormal   Collection Time: 01/24/21  3:50 AM  Result Value Ref Range   WBC 9.6 4.0 - 10.5 K/uL   RBC 1.97 (L) 3.87 - 5.11 MIL/uL   Hemoglobin 7.6 (L) 12.0 - 15.0 g/dL   HCT 24.5 (L) 36.0 - 46.0 %   MCV 124.4 (H) 80.0 - 100.0 fL   MCH 38.6 (H) 26.0 - 34.0 pg   MCHC 31.0 30.0 - 36.0 g/dL   RDW 21.9 (H) 11.5 - 15.5 %   Platelets 155 150 - 400 K/uL   nRBC 0.5 (H) 0.0 - 0.2 %   Neutrophils Relative % 55 %   Neutro Abs 5.2 1.7 - 7.7 K/uL   Lymphocytes Relative 35 %   Lymphs Abs 3.4 0.7 - 4.0 K/uL   Monocytes Relative 9 %   Monocytes Absolute 0.9 0.1 - 1.0 K/uL   Eosinophils Relative 0 %   Eosinophils Absolute 0.0 0.0 - 0.5 K/uL   Basophils Relative 0 %   Basophils Absolute  0.0 0.0 - 0.1 K/uL   Immature Granulocytes 1 %   Abs Immature Granulocytes 0.06 0.00 - 0.07 K/uL    Comment: Performed at [facility], [address], [place], [place] [address]  Troponin I (High Sensitivity)     Status: None   Collection Time: 01/24/21  3:50 AM  Result Value Ref Range   Troponin I (High Sensitivity) 11 <18 ng/L    Comment: (NOTE) Elevated high sensitivity troponin I (hsTnI) values and significant  changes across serial measurements may suggest ACS but many other  chronic and acute conditions are known to elevate hsTnI results.  Refer to the Links section for chest pain algorithms and additional  guidance. Performed at [facility], [place] [address], [place], [place] [address]   Type and screen [place]     Status: None   Collection Time: 01/24/21  3:50 AM  Result Value Ref Range   ABO/RH(D) A POS    Antibody Screen NEG    Sample Expiration      01/27/2021,2359 Performed at [facility], [place] [address], [place], [place] [address]   Protime-INR     Status: Abnormal   Collection Time: 01/24/21  3:50 AM  Result Value Ref Range   Prothrombin Time 17.0 (H) 11.4 - 15.2 seconds   INR 1.4 (H) 0.8 - 1.2    Comment: (NOTE) INR goal varies based on device and disease states. Performed at [facility], [place] [address] [place], [place] [address]   Ammonia     Status: None   Collection Time: 01/24/21  3:54 AM  Result Value Ref Range   Ammonia 12 9 - 35 umol/L    Comment: Performed at [facility], [place] [address] [place], [place] [address]     ROS: As per H&P.  Rest of the systems reviewed and are negative.  Physical Exam: Vitals:   01/24/21 0443 01/24/21 0445  BP:    Pulse: 98   Resp: (!) 22   Temp:    SpO2: (S) (!) 86% (S) 94%     General exam: Appears calm and comfortable  Respiratory system: Clear to auscultation. Respiratory effort normal. No wheezing or crackle Cardiovascular system: S1 & S2 heard,  RRR.  No pedal edema. Gastrointestinal system: Abdomen is nondistended, soft and nontender. Normal bowel sounds heard. Central nervous system: Alert and oriented. No focal neurological deficits. Extremities: Symmetric 5 x 5 power. Skin: No rashes, lesions or ulcers Psychiatry: Alert awake and following commands.   Assessment/Plan:  #Acute kidney injury likely hemodynamically mediated in the setting of hypotension concomitant with use of lisinopril/Lasix: Presented with nausea vomiting and decreased oral intake.  She was hypotensive in ER which was improved with IV fluid resuscitation.  Baseline creatinine level is around 0.9-1.1.  Bladder scan rule out urinary retention. I will check UA, kidney ultrasound, She already received treatment of hyperkalemia. Repeat renal panel, strict ins and out, continue IV fluid. Hold lisinopril, Lasix. No need for dialysis at this time.  #Hyperkalemia in the setting of AKI and lisinopril use.  She received medical treatment in ER including IV fluid, insulin, dextrose, calcium, Lokelma, bicarbonate and Lasix.  Repeat lab.  Hold lisinopril.  #Hypotension presumably due to volume depletion concomitant with antihypertensives: Blood pressure improved after fluid resuscitation in ER.  Hold antihypertensives.  #Anion gap metabolic acidosis, lactic acidosis: Work-up for infection/sepsis per medical team as patient is immunocompromised.  Received fluid, monitor lab.  #Endometrial cancer currently on chemotherapy at [facility].  Discussed with the patient, patient's husband and ER staff's  Thank you for the consult.  We will follow with you.  Deysi Soldo Tanna Furry 01/24/2021, 6:17 AM  [facility].

## 2021-01-24 NOTE — ED Provider Notes (Signed)
Emergency Department Provider Note   I have reviewed the triage vital signs and the nursing notes.   HISTORY  Chief Complaint Emesis (Pt from home with bloody emesis and low BP)   HPI Becky Martin is a 66 y.o. female with past medical history of endometrial cancer and DVT on Eliquis presents to the emergency department with hypotension by EMS.  They were called to the scene with patient vomiting.  Question some report of hematemesis.  Patient is unable to provide a significant history and apparently the husband is in route.  EMS established an IV in her right foot and gave approximately 200 mL of fluid in route.   Level 5 caveat: AMS  The patient's husband arrived to the emergency department shortly after the patient.  He tells me that since around 2 PM today she has had nausea and vomiting multiple times.  He denies any blood in the emesis.  He is not aware of diarrhea.  He tells me that her altered mental status is mostly baseline for her since her chemotherapy.  He states that she is been unable to keep down solids or liquids due to vomiting.  Ultimately called EMS with increased weakness.   Past Medical History:  Diagnosis Date   Diabetes mellitus type 2 in obese ([place])    DVT (deep venous thrombosis) ([place])    Endometrial cancer ([place])    Hyperthyroidism     Review of Systems  Constitutional: No fever/chills Cardiovascular: Denies chest pain. Respiratory: Denies shortness of breath. Gastrointestinal: No abdominal pain.  Neurological: Negative for headaches.  Level 5 caveat: AMS ____________________________________________   PHYSICAL EXAM:  VITAL SIGNS: Vitals:   01/24/21 2141 01/25/21 0013  BP: (!) 100/58 106/64  Pulse: 85 93  Resp: 13 20  Temp: 98.4 F (36.9 C)   SpO2: 97% 96%    Constitutional: Alert but confused. Sitting up in bed and fully conversational but some word finding difficulty and mild confusion.  Eyes: Conjunctivae are normal.  Head:  Atraumatic. Nose: No congestion/rhinnorhea. Mouth/Throat: Mucous membranes are dry.  Neck: No stridor.  Cardiovascular: Normal rate, regular rhythm. Good peripheral circulation. Grossly normal heart sounds.   Respiratory: Normal respiratory effort.  No retractions. Lungs CTAB. Gastrointestinal: Soft and nontender. No distention.  Musculoskeletal: No lower extremity tenderness nor edema. No gross deformities of extremities. Neurologic:  Normal speech and language. No gross focal neurologic deficits are appreciated.  Skin:  Skin is warm, dry and intact. No rash noted.   ____________________________________________   LABS (all labs ordered are listed, but only abnormal results are displayed)  Labs Reviewed  COMPREHENSIVE METABOLIC PANEL - Abnormal; Notable for the following components:      Result Value   Potassium 7.0 (*)    CO2 20 (*)    Glucose, Bld 182 (*)    BUN 30 (*)    Creatinine, Ser 3.55 (*)    AST 42 (*)    GFR, Estimated 14 (*)    Anion gap 17 (*)    All other components within normal limits  LACTIC ACID, PLASMA - Abnormal; Notable for the following components:   Lactic Acid, Venous 5.5 (*)    All other components within normal limits  LACTIC ACID, PLASMA - Abnormal; Notable for the following components:   Lactic Acid, Venous 7.0 (*)    All other components within normal limits  CBC WITH DIFFERENTIAL/PLATELET - Abnormal; Notable for the following components:   RBC 1.97 (*)    Hemoglobin 7.6 (*)  HCT 24.5 (*)    MCV 124.4 (*)    MCH 38.6 (*)    RDW 21.9 (*)    nRBC 0.5 (*)    All other components within normal limits  PROTIME-INR - Abnormal; Notable for the following components:   Prothrombin Time 17.0 (*)    INR 1.4 (*)    All other components within normal limits  URINALYSIS, ROUTINE W REFLEX MICROSCOPIC - Abnormal; Notable for the following components:   Glucose, UA 250 (*)    Hgb urine dipstick LARGE (*)    Protein, ur 30 (*)    Leukocytes,Ua SMALL  (*)    All other components within normal limits  RENAL FUNCTION PANEL - Abnormal; Notable for the following components:   CO2 20 (*)    Glucose, Bld 212 (*)    BUN 27 (*)    Creatinine, Ser 3.08 (*)    Calcium 8.6 (*)    Albumin 3.3 (*)    GFR, Estimated 16 (*)    Anion gap 17 (*)    All other components within normal limits  URIC ACID - Abnormal; Notable for the following components:   Uric Acid, Serum 8.0 (*)    All other components within normal limits  HEMOGLOBIN AND HEMATOCRIT, BLOOD - Abnormal; Notable for the following components:   Hemoglobin 6.7 (*)    HCT 20.4 (*)    All other components within normal limits  TSH - Abnormal; Notable for the following components:   TSH 8.388 (*)    All other components within normal limits  URINALYSIS, MICROSCOPIC (REFLEX) - Abnormal; Notable for the following components:   Bacteria, UA FEW (*)    All other components within normal limits  HEMOGLOBIN AND HEMATOCRIT, BLOOD - Abnormal; Notable for the following components:   Hemoglobin 7.4 (*)    HCT 23.9 (*)    All other components within normal limits  LACTIC ACID, PLASMA - Abnormal; Notable for the following components:   Lactic Acid, Venous 3.7 (*)    All other components within normal limits  LACTIC ACID, PLASMA - Abnormal; Notable for the following components:   Lactic Acid, Venous 3.3 (*)    All other components within normal limits  CBG MONITORING, ED - Abnormal; Notable for the following components:   Glucose-Capillary 197 (*)    All other components within normal limits  CBG MONITORING, ED - Abnormal; Notable for the following components:   Glucose-Capillary 110 (*)    All other components within normal limits  CBG MONITORING, ED - Abnormal; Notable for the following components:   Glucose-Capillary 122 (*)    All other components within normal limits  RESP PANEL BY RT-PCR (FLU A&B, COVID) ARPGX2  CULTURE, BLOOD (ROUTINE X 2)  CULTURE, BLOOD (ROUTINE X 2)  URINE CULTURE   LIPASE, BLOOD  RAPID URINE DRUG SCREEN, HOSP PERFORMED  ETHANOL  AMMONIA  MAGNESIUM  T4, FREE  APTT  OCCULT BLOOD X 1 CARD TO LAB, STOOL  COMPREHENSIVE METABOLIC PANEL  HEPARIN LEVEL (UNFRACTIONATED)  APTT  CBC  TYPE AND SCREEN  ABO/RH  PREPARE RBC (CROSSMATCH)  TROPONIN I (HIGH SENSITIVITY)  TROPONIN I (HIGH SENSITIVITY)   ____________________________________________  EKG   EKG Interpretation  Date/Time:  Thursday January 24 2021 03:28:21 EST Ventricular Rate:  56 PR Interval:    QRS Duration: 117 QT Interval:  498 QTC Calculation: 481 R Axis:   -76 Text Interpretation: Junctional rhythm Nonspecific IVCD with LAD Low voltage, extremity and precordial leads No old  tracing to compare Confirmed by Nanda Quinton [phone]) on 01/24/2021 3:31:41 AM        ____________________________________________  RADIOLOGY  DG Chest Portable 1 View  Result Date: 01/24/2021 CLINICAL DATA:  Shortness of breath and emesis EXAM: PORTABLE CHEST 1 VIEW COMPARISON:  08/31/2020 chest CT FINDINGS: Artifact from EKG leads. Enlarged heart size.  Negative mediastinal contours. Artifact from EKG leads. Porta catheter with tip at the right atrium. There is no edema, consolidation, effusion, or pneumothorax. IMPRESSION: No evidence of acute disease. Electronically Signed   By: Jorje Guild M.D.   On: 01/24/2021 04:47    ____________________________________________   PROCEDURES  Procedure(s) performed:   Procedures  CRITICAL CARE Performed by: Margette Fast Total critical care time: 75 minutes Critical care time was exclusive of separately billable procedures and treating other patients. Critical care was necessary to treat or prevent imminent or life-threatening deterioration. Critical care was time spent personally by me on the following activities: development of treatment plan with patient and/or surrogate as well as nursing, discussions with consultants, evaluation of patient's  response to treatment, examination of patient, obtaining history from patient or surrogate, ordering and performing treatments and interventions, ordering and review of laboratory studies, ordering and review of radiographic studies, pulse oximetry and re-evaluation of patient's condition.  Nanda Quinton, MD Emergency Medicine  ____________________________________________   INITIAL IMPRESSION / ASSESSMENT AND PLAN / ED COURSE  Pertinent labs & imaging results that were available during my care of the patient were reviewed by me and considered in my medical decision making (see chart for details).   This patient is Presenting for Evaluation of Hypotension and AMS, which does require a range of treatment options, and is a complaint that involves a high risk of morbidity and mortality.  The Differential Diagnoses include sepsis, acute blood loss anemia from GI bleed, severe dehydration, metabolic encephalopathy.  Critical Interventions- IVF boluses and labs along with imaging.    Reassessment after intervention: BP improved. Patient remains altered. No focal neuro deficits.    I did Additional Historical Information from patient's husband, as the patient is altered/confused. He confirms history of non-bloody emesis starting today.   I decided to review pertinent External Data, and in summary patient followed primarily in the [facility] system. Her Oncologist is Dr. Wendee Beavers and her last dose of chemotherapy was 12/26/20 (carboplatin, AUC, and Paclitaxel).    Clinical Laboratory Tests Ordered, included CBC, CMP, Lactic Acid, Troponin, UDS, UA, INR, and Type/Screen. Patient found to be in renal failure, new from labs at [facility], along with severe hyperkalemia. EKG interpreted by me as above. Anemia noted as well although Hb > 7 for now. Will continue to follow. Lactic acidosis noted but suspect this is from acute renal failure rather than sepsis, although sepsis was considered.    Radiologic Tests Ordered, included CXR which I independently evaluated and agree with radiology interpretation. No acute disease.   Cardiac Monitor Tracing which shows sinus tachycardia with hypotension on arrival.    Reevaluation with update and discussion with patient and husband. Discussed renal failure labs and severe hyperkalemia. Anemia noted as well but no signs of active GI bleeding for now.   04:52 AM Patient appears to be in acute renal failure by labs with potassium of 7.0.  EKG shows junctional rhythm.  In comparison with her labs in care everywhere from mid December the patient typically has normal renal function.  No leukocytosis.  Lactic acid of 5.5 likely related to acute renal  failure rather than sepsis, although this was considered.  Troponin of 11.  Suspect renal failure from GI losses.   Consult complete with Nephrology, ICU, and Hospitalist   05:00 AM Spoke with Dr. Carolin Sicks with nephrology. Agrees with meds given thus far. He will evaluate.   ICU came to evaluate at bedside. They advise [facility] admit with improving BP after IVF. Patient looking improved with sBP now > 100 on multiple readings.   Discussed patient's case with [facility] to request admission. Patient and family (if present) updated with plan. Care transferred to [facility] service.  I reviewed all nursing notes, vitals, pertinent old records, EKGs, labs, imaging (as available).   Medical Decision Making: Summary:  Patient arrives with hypotension and AMS. Awake and alert but confused. Possible vomiting with GI bleeding second hard from EMS. Patient is on Eliquis. Will begin fluid resuscitation and f/u with husband for additional history. Septic Shock is a consideration but history less supportive of this. Last chemo was almost 1 month prior.   Disposition: Admit  ____________________________________________  FINAL CLINICAL IMPRESSION(S) / ED DIAGNOSES  Final diagnoses:  Hypotension due to hypovolemia  Nausea  and vomiting, unspecified vomiting type  Acute renal failure, unspecified acute renal failure type ([place])  Hyperkalemia     MEDICATIONS GIVEN DURING THIS VISIT:  Medications  lactated ringers infusion ( Intravenous New Bag/Given 01/24/21 1753)  sodium chloride flush (NS) 0.9 % injection 3 mL (3 mLs Intravenous Given 01/24/21 2142)  acetaminophen (TYLENOL) tablet 650 mg (has no administration in time range)    Or  acetaminophen (TYLENOL) suppository 650 mg (has no administration in time range)  albuterol (PROVENTIL) (2.5 MG/3ML) 0.083% nebulizer solution 2.5 mg (has no administration in time range)  ondansetron (ZOFRAN) tablet 4 mg (has no administration in time range)    Or  ondansetron (ZOFRAN) injection 4 mg (has no administration in time range)  OLANZapine (ZYPREXA) tablet 2.5 mg (2.5 mg Oral Given 01/24/21 2143)  FLUoxetine (PROZAC) capsule 40 mg (40 mg Oral Not Given 01/24/21 1154)  darifenacin (ENABLEX) 24 hr tablet 15 mg (15 mg Oral Not Given 01/24/21 1226)  insulin aspart (novoLOG) injection 0-6 Units (0 Units Subcutaneous Not Given 01/24/21 1644)  rosuvastatin (CRESTOR) tablet 30 mg (30 mg Oral Given 01/24/21 1810)  metroNIDAZOLE (FLAGYL) IVPB 500 mg (500 mg Intravenous New Bag/Given 01/24/21 2146)  ceFEPIme (MAXIPIME) 2 g in sodium chloride 0.9 % 100 mL IVPB (has no administration in time range)  vancomycin variable dose per unstable renal function (pharmacist dosing) (has no administration in time range)  sodium chloride flush (NS) 0.9 % injection 10-40 mL (10 mLs Intracatheter Given 01/24/21 2143)  sodium chloride flush (NS) 0.9 % injection 10-40 mL (has no administration in time range)  Chlorhexidine Gluconate Cloth 2 % PADS 6 each (6 each Topical Given 01/24/21 1748)  sodium chloride 0.9 % bolus 2,000 mL (0 mLs Intravenous Stopped 01/24/21 0553)  ondansetron (ZOFRAN) injection 4 mg (4 mg Intravenous Given 01/24/21 0349)  furosemide (LASIX) injection 40 mg (40 mg Intravenous Given  01/24/21 0540)  sodium zirconium cyclosilicate (LOKELMA) packet 10 g (10 g Oral Given 01/24/21 0538)  calcium gluconate 1 g/ 50 mL sodium chloride IVPB (0 mg Intravenous Stopped 01/24/21 1057)  albuterol (PROVENTIL) (2.5 MG/3ML) 0.083% nebulizer solution 10 mg (10 mg Nebulization Given 01/24/21 0600)  insulin aspart (novoLOG) injection 5 Units (5 Units Intravenous Given 01/24/21 0545)    And  dextrose 50 % solution 50 mL (50 mLs Intravenous Given 01/24/21 0553)  dextrose 10 % infusion (0 mLs Intravenous Stopped 01/24/21 1340)  sodium bicarbonate injection 50 mEq (50 mEq Intravenous Given 01/24/21 0547)  lactated ringers bolus 1,000 mL (0 mLs Intravenous Stopped 01/24/21 0745)  sodium chloride 0.9 % bolus 1,000 mL (0 mLs Intravenous Stopped 01/24/21 1123)  0.9 %  sodium chloride infusion (Manually program via Guardrails IV Fluids) (0 mLs Intravenous Stopped 01/24/21 1459)  ceFEPIme (MAXIPIME) 2 g in sodium chloride 0.9 % 100 mL IVPB (0 g Intravenous Stopped 01/24/21 1228)  vancomycin (VANCOREADY) IVPB 1500 mg/300 mL (0 mg Intravenous Stopped 01/24/21 1548)     Note:  This document was prepared using Dragon voice recognition software and may include unintentional dictation errors.  Nanda Quinton, MD, [facility] Emergency Medicine    Cailyn Houdek, [facility], MD 01/25/21 [phone]

## 2021-01-24 NOTE — ED Triage Notes (Signed)
Pts husband called EMS d/t bloody emesis. States pt has not been drinking and what little she does drink she vomits.  EMS reports SBP in 40s at pts home. Hx of cancer

## 2021-01-24 NOTE — Progress Notes (Signed)
PCCM Note  66 year old female with endometrial cancer s/p recent completion of chemotherapy in 12/2020 at [facility] and DVT on anticoagulation. She presents with one day history of nausea and vomiting. She was initially hypotensive with SBP in the 60s and given 2L IVF. Labs significant for renal failure with K 7.0 and LA 5.5. ED consulted Nephrology who recommended hyperkalemia protocol. Given lokelma. PCCM consulted to evaluate for ICU admission. On my exam patient with SBP in 100-130s and AAOx3.   Recommend additional 1L LR and repeating labs after hyperkalemia protocol. If improving, no indication to transfer to ICU at this time.  Please call for any clinical changes or worsening status  PCCM available as needed.  Rodman Pickle, M.D. [facility] Pulmonary/Critical Care Medicine 01/24/2021 6:22 AM   See Amion for personal pager For hours between 7 PM to 7 AM, please call [place] for urgent questions

## 2021-01-24 NOTE — Plan of Care (Signed)

## 2021-01-24 NOTE — ED Notes (Signed)
Becky Martin husband [phone] requesting an update

## 2021-01-24 NOTE — Progress Notes (Signed)
[facility] for Heparin drip Indication: pulmonary embolus  Allergies  Allergen Reactions   Other Hives    Sausage   Atenolol Other (See Comments)    Syncope, bradycardia (occurred in the setting of AKI) Syncope, bradycardia (occurred in the setting of AKI)    Lithium Other (See Comments)    Interacts with other meds that she takes   Metformin Nausea Only   Codeine Itching and Rash   Diazepam Itching and Rash   Fexofenadine Itching and Rash    Patient Measurements: Height: 5\' 5"  (165.1 cm) Weight: 97.5 kg (215 lb) IBW/kg (Calculated) : 57 Heparin Dosing Weight: 79 kg  Vital Signs: Temp: 98.4 F (36.9 C) (01/12 1105) Temp Source: Oral (01/12 1105) BP: 101/47 (01/12 1105) Pulse Rate: 95 (01/12 1105)  Labs: Recent Labs    01/24/21 0350 01/24/21 0650 01/24/21 1015  HGB 7.6*  --  6.7*  HCT 24.5*  --  20.4*  PLT 155  --   --   LABPROT 17.0*  --   --   INR 1.4*  --   --   CREATININE 3.55* 3.08*  --   TROPONINIHS 11 14  --     Estimated Creatinine Clearance: 20.8 mL/min (A) (by C-G formula based on SCr of 3.08 mg/dL (H)).   Medical History: Past Medical History:  Diagnosis Date   Diabetes mellitus type 2 in obese ([place])    DVT (deep venous thrombosis) ([facility])    Endometrial cancer ([facility])    Hyperthyroidism     Medications:  Infusions:   heparin     lactated ringers 125 mL/hr at 01/24/21 0744    Assessment:  66 y.o. female with medical history significant of hypertension, DVT/PE on Eliquis, endometrial adenocarcinoma s/p  chemotherapy, and hyperthyroidism presents with complaints of nausea and vomiting. Last dose of  Eliquis 1/11 PM per husband  Patient presented with bloody emesis on admission No current signs/symptoms of bleed  Goal of Therapy:  Heparin level 0.3-0.7 units/ml aPTT 66-102 seconds Monitor platelets by anticoagulation protocol: Yes   Plan:  Start heparin 1200 units/hr (12 ml/hr) with no bolus 2/2  recent apixaban use aPTT @1800  Daily aPTT, heparin level, and CBC Monitor closely for signs/symptoms of bleed  Thank you for allowing pharmacy to be a part of this patients care.  Donnald Garre, PharmD Clinical Pharmacist  Please check AMION for all [place] numbers After 10:00 PM, call [place] [phone]

## 2021-01-24 NOTE — ED Notes (Signed)
Pt more alert, states feeling much better after transfusion.

## 2021-01-24 NOTE — Progress Notes (Signed)
Pharmacy Antibiotic Note  Becky Martin is a 66 y.o. female admitted on 01/24/2021 with sepsis of unknown origin in an immunocompromised patient.  Pharmacy has been consulted for Vanco/cefepime dosing.  Scr 3.55>>3.08 (BL 0.9-1.1)  Plan: Vancomycin 1500mg  LD x1 followed by variable dosing  Cefepime 2gm q24hr Plan to schedule subsequent dosing based on renal function trend Will monitor for acute changes in renal function and adjust as needed F/u cultures results and de-escalate as appropriate  Height: 5\' 5"  (165.1 cm) Weight: 97.5 kg (215 lb) IBW/kg (Calculated) : 57  Temp (24hrs), Avg:98.5 F (36.9 C), Min:98.3 F (36.8 C), Max:98.7 F (37.1 C)  Recent Labs  Lab 01/24/21 0339 01/24/21 0350 01/24/21 0650  WBC  --  9.6  --   CREATININE  --  3.55* 3.08*  LATICACIDVEN 5.5*  --  7.0*    Estimated Creatinine Clearance: 20.8 mL/min (A) (by C-G formula based on SCr of 3.08 mg/dL (H)).    Allergies  Allergen Reactions   Other Hives    Sausage   Atenolol Other (See Comments)    Syncope, bradycardia (occurred in the setting of AKI) Syncope, bradycardia (occurred in the setting of AKI)    Lithium Other (See Comments)    Interacts with other meds that she takes   Metformin Nausea Only   Codeine Itching and Rash   Diazepam Itching and Rash   Fexofenadine Itching and Rash    Antimicrobials this admission: Vancomycin 1/12 >>  Cefepime 1/12 >>  Metronidazole 1/12 >>  Dose adjustments this admission: none  Microbiology results: 1/12 BCx: IP 1/12 UCx: IP   Thank you for allowing pharmacy to be a part of this patients care.  Donnald Garre, PharmD Clinical Pharmacist  Please check AMION for all [place] numbers After 10:00 PM, call [place] [phone]

## 2021-01-24 NOTE — Progress Notes (Signed)
Repeat blood work noted hyperkalemia resolved and provement and acute kidney injury.  Case discussed with patient's oncologist over the phone Dr. 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  Repeat blood count noted to be lower at 6.7.  Transfusion of 1 unit of packed red blood cells ordered.  Initial orders have been put in place to place on heparin drip, but subsequently were discontinued and will place on SCDs.  Patient was noted to have pink urine which could be the possible source of bleeding.  Check stool guaiac.  Repeat lactic acid was noted to be 7.  Continue IV fluids and empiric antibiotics given patient immunocompromise status.

## 2021-01-24 NOTE — H&P (Addendum)
History and Physical    CORTNY BAMBACH KYH:[id number] DOB: 01/18/1955 DOA: 01/24/2021  Referring MD/NP/PA: Mitzi Hansen, MD PCP: Leonides Sake, MD  Consultants: Pablo Ledger, MD and Dustin Folks, MD- hematology oncology Patient coming from: Home(lives with husband) via EMS  Chief Complaint: Nausea and vomiting  I have personally briefly reviewed patient's old medical records in [place]   HPI: CARREN Martin is a 66 y.o. female with medical history significant of hypertension, DVT/PE on Eliquis, endometrial adenocarcinoma s/p chemotherapy, and hyperthyroidism presents with complaints of nausea and vomiting.  History is obtained from the patient with the assistance of her husband over the phone.  She had completed her last of 6 chemotherapy infusion of carboplatin and paclitaxel on 12/26/2020.  Since patient had been receiving chemotherapy she reports that it had caused her to be nauseous and vomit.  Symptoms are usually improved with antiemetics.  However, yesterday around 2:30 PM she started being able to eat and drink.  Her husband states that she started throwing up and was unable to stop.  He had given her a nausea pill and waited 6 hours before giving her another.  She continued to have nausea and vomiting despite this.  Thereafter he noted that she seemed to be getting confused.  She did not know who or where she was, and was not making sense with what she was saying.  He noticed yesterday that her urine was bloody.  Patient recalls also having malaise, palpitations, and some difficulty breathing.  Denies having any significant cough, fever, chest pain, abdominal pain, or dysuria. He called EMS this morning as he seemed to be getting worse bring her to the hospital for further evaluation.   ED Course: Upon admission into the emergency department patient was noted to be afebrile, pulse 57-98, respiration 19-22, blood pressure as low as 61/47-114/77, and O2 saturations as low as  86% on room air with improvement on 2 L nasal cannula oxygen to 94%.  Labs significant for hemoglobin 7.6, MCV 124.4, MCH 38.6, potassium 7, CO2 20, BUN 30, creatinine 3.55, anion gap 17, AST 42, lactic acid 5.5, INR 1.4, and high-sensitivity troponin 11.  Chest x-ray did not note any acute abnormalities.  Renal ultrasound was pending.  Critical care has been consulted due to hypotension, but recommended continued IV fluid resuscitation.  Nephrology have been consulted due to hyperkalemia.  He had been ordered a total of 3 L of IV fluids, sodium bicarb 1 amp, insulin 5 units, and dextrose amp, Lasix 40 mg IV, calcium gluconate, Lokelma 10 g p.o., and albuterol.  Review of Systems  Constitutional:  Positive for malaise/fatigue. Negative for weight loss.  Eyes:  Negative for photophobia and pain.  Respiratory:  Positive for shortness of breath.   Cardiovascular:  Negative for chest pain and leg swelling.  Gastrointestinal:  Positive for nausea and vomiting. Negative for abdominal pain, blood in stool and diarrhea.  Genitourinary:  Negative for dysuria.  Neurological:  Positive for weakness. Negative for loss of consciousness.  Psychiatric/Behavioral:  Negative for substance abuse.    Past Medical History:  Diagnosis Date   Diabetes mellitus type 2 in obese (HCC)    DVT (deep venous thrombosis) (HCC)    Endometrial cancer (HCC)    Hyperthyroidism      Allergies  Allergen Reactions   Other Hives    Sausage   Atenolol Other (See Comments)    Syncope, bradycardia (occurred in the setting of AKI) Syncope, bradycardia (occurred in  the setting of AKI)    Lithium Other (See Comments)    Interacts with other meds that she takes   Metformin Nausea Only   Codeine Itching and Rash   Diazepam Itching and Rash   Fexofenadine Itching and Rash      Prior to Admission medications   Medication Sig Start Date End Date Taking? Authorizing Provider  ELIQUIS 5 MG TABS tablet Take 5 mg by mouth 2  (two) times daily. 01/14/21  Yes [provider]  FLUoxetine (PROZAC) 40 MG capsule Take 40 mg by mouth daily. 12/22/20  Yes [provider]  furosemide (LASIX) 20 MG tablet Take 20 mg by mouth 2 (two) times daily. 01/16/21  Yes [provider]  ibuprofen (ADVIL) 800 MG tablet Take 800 mg by mouth every 6 (six) hours as needed for mild pain or moderate pain. 06/09/20  Yes [provider]  lisinopril (ZESTRIL) 5 MG tablet Take 5 mg by mouth daily. 01/16/21  Yes [provider]  meclizine (ANTIVERT) 25 MG tablet Take 50 mg by mouth 2 (two) times daily. 01/17/21  Yes [provider]  methimazole (TAPAZOLE) 10 MG tablet Take by mouth. 12/08/20  Yes [provider]  metoprolol succinate (TOPROL-XL) 25 MG 24 hr tablet Take 25 mg by mouth 2 (two) times daily. 11/28/20  Yes [provider]  OLANZapine (ZYPREXA) 2.5 MG tablet Take 2.5 mg by mouth at bedtime. 01/03/21  Yes [provider]  OZEMPIC, 1 MG/DOSE, 4 MG/3ML SOPN Inject 1 mg into the skin once a week. 12/05/20  Yes [provider]  prochlorperazine (COMPAZINE) 10 MG tablet Take 10 mg by mouth every 6 (six) hours as needed for vomiting or nausea. 09/12/20  Yes [provider]  rosuvastatin (CRESTOR) 20 MG tablet Take by mouth. 12/03/20  Yes [provider]  solifenacin (VESICARE) 10 MG tablet Take 10 mg by mouth daily. 12/10/20  Yes [provider]    Physical Exam:  Constitutional: Chronically ill-appearing female Vitals:   01/24/21 0331 01/24/21 0340 01/24/21 0443 01/24/21 0445  BP: (!) 61/47 114/77    Pulse: (!) 57 (!) 54 98   Resp: 19 (!) 21 (!) 22   Temp: 98.7 F (37.1 C)     TempSrc: Oral     SpO2: 95% 95% (S) (!) 86% (S) 94%   Eyes: PERRL, lids and conjunctivae normal ENMT: Mucous membranes are dry. Posterior pharynx clear of any exudate or lesions.  Neck: normal, supple, no masses, no thyromegaly Respiratory: Normal  respiratory effort with mild crackles noted at the bases.  O2 saturation currently maintained on room air. Cardiovascular: Regular rate and rhythm, no murmurs / rubs / gallops. No extremity edema. Abdomen: no tenderness, no masses palpated. No hepatosplenomegaly. Bowel sounds positive.  Musculoskeletal: no clubbing / cyanosis. No joint deformity upper and lower extremities. Good ROM, no contractures. Normal muscle tone.  Skin: no rashes, lesions, ulcers. No induration.  Pallor present.   Neurologic: CN 2-12 grossly intact. Strength 4/5 in all extremities Psychiatric: Normal judgment and insight. Alert and oriented x 3. Normal mood.     Labs on Admission: I have personally reviewed following labs and imaging studies  CBC: Recent Labs  Lab 01/24/21 0350  WBC 9.6  NEUTROABS 5.2  HGB 7.6*  HCT 24.5*  MCV 124.4*  PLT 381   Basic Metabolic Panel: Recent Labs  Lab 01/24/21 0350  NA 139  K 7.0*  CL 102  CO2 20*  GLUCOSE 182*  BUN  30*  CREATININE 3.55*  CALCIUM 9.3   GFR: CrCl cannot be calculated (Unknown ideal weight.). Liver Function Tests: Recent Labs  Lab 01/24/21 0350  AST 42*  ALT 21  ALKPHOS 54  BILITOT 0.5  PROT 6.8  ALBUMIN 3.6   Recent Labs  Lab 01/24/21 0350  LIPASE 29   Recent Labs  Lab 01/24/21 0354  AMMONIA 12   Coagulation Profile: Recent Labs  Lab 01/24/21 0350  INR 1.4*   Cardiac Enzymes: No results for input(s): CKTOTAL, CKMB, CKMBINDEX, TROPONINI in the last 168 hours. BNP (last 3 results) No results for input(s): PROBNP in the last 8760 hours. HbA1C: No results for input(s): HGBA1C in the last 72 hours. CBG: Recent Labs  Lab 01/24/21 0657  GLUCAP 197*   Lipid Profile: No results for input(s): CHOL, HDL, LDLCALC, TRIG, CHOLHDL, LDLDIRECT in the last 72 hours. Thyroid Function Tests: No results for input(s): TSH, T4TOTAL, FREET4, T3FREE, THYROIDAB in the last 72 hours. Anemia Panel: No results for input(s): VITAMINB12,  FOLATE, FERRITIN, TIBC, IRON, RETICCTPCT in the last 72 hours. Urine analysis: No results found for: COLORURINE, APPEARANCEUR, LABSPEC, PHURINE, GLUCOSEU, HGBUR, BILIRUBINUR, KETONESUR, PROTEINUR, UROBILINOGEN, NITRITE, LEUKOCYTESUR Sepsis Labs: Recent Results (from the past 240 hour(s))  Resp Panel by RT-PCR (Flu A&B, Covid) Nasopharyngeal Swab     Status: None   Collection Time: 01/24/21  3:40 AM   Specimen: Nasopharyngeal Swab; Nasopharyngeal(NP) swabs in vial transport medium  Result Value Ref Range Status   SARS Coronavirus 2 by RT PCR NEGATIVE NEGATIVE Final    Comment: (NOTE) SARS-CoV-2 target nucleic acids are NOT DETECTED.  The SARS-CoV-2 RNA is generally detectable in upper respiratory specimens during the acute phase of infection. The lowest concentration of SARS-CoV-2 viral copies this assay can detect is 138 copies/mL. A negative result does not preclude SARS-Cov-2 infection and should not be used as the sole basis for treatment or other patient management decisions. A negative result may occur with  improper specimen collection/handling, submission of specimen other than nasopharyngeal swab, presence of viral mutation(s) within the areas targeted by this assay, and inadequate number of viral copies(<138 copies/mL). A negative result must be combined with clinical observations, patient history, and epidemiological information. The expected result is Negative.  Fact Sheet for Patients:  EntrepreneurPulse.com.au  Fact Sheet for Healthcare Providers:  IncredibleEmployment.be  This test is no t yet approved or cleared by the [place] FDA and  has been authorized for detection and/or diagnosis of SARS-CoV-2 by FDA under an Emergency Use Authorization (EUA). This EUA will remain  in effect (meaning this test can be used) for the duration of the COVID-19 declaration under Section 564(b)(1) of the Act, 21 U.S.C.section 360bbb-3(b)(1),  unless the authorization is terminated  or revoked sooner.       Influenza A by PCR NEGATIVE NEGATIVE Final   Influenza B by PCR NEGATIVE NEGATIVE Final    Comment: (NOTE) The Xpert Xpress SARS-CoV-2/FLU/RSV plus assay is intended as an aid in the diagnosis of influenza from Nasopharyngeal swab specimens and should not be used as a sole basis for treatment. Nasal washings and aspirates are unacceptable for Xpert Xpress SARS-CoV-2/FLU/RSV testing.  Fact Sheet for Patients: EntrepreneurPulse.com.au  Fact Sheet for Healthcare Providers: IncredibleEmployment.be  This test is not yet approved or cleared by the [place] FDA and has been authorized for detection and/or diagnosis of SARS-CoV-2 by FDA under an Emergency Use Authorization (EUA). This EUA will remain in effect (meaning this test can be used) for  the duration of the COVID-19 declaration under Section 564(b)(1) of the Act, 21 U.S.C. section 360bbb-3(b)(1), unless the authorization is terminated or revoked.  Performed at [facility], [place] [address], [place], [place] [address]      Radiological Exams on Admission: DG Chest Portable 1 View  Result Date: 01/24/2021 CLINICAL DATA:  Shortness of breath and emesis EXAM: PORTABLE CHEST 1 VIEW COMPARISON:  08/31/2020 chest CT FINDINGS: Artifact from EKG leads. Enlarged heart size.  Negative mediastinal contours. Artifact from EKG leads. Porta catheter with tip at the right atrium. There is no edema, consolidation, effusion, or pneumothorax. IMPRESSION: No evidence of acute disease. Electronically Signed   By: Jorje Guild M.D.   On: 01/24/2021 04:47    EKG: Independently reviewed.  Junctional rhythm at 56 bpm  Assessment/Plan Hyperkalemia: Patient presents with potassium elevated up to 7.  Contributing factors likely include medications of lisinopril.  Patient had been treated with IV fluids, calcium gluconate, albuterol, insulin,  dextrose, sodium bicarb, Lasix, and Lokelma. -Admit to a progressive bed -Serial monitoring of potassium levels -Continue lactated Ringer's 125 mL/h  Transient hypotension: Acute.  Patient initially noted to have blood pressure as low as 61/47.  Blood pressures improved with IV fluid boluses.  Home medication regimen includes furosemide 20 mg twice daily, lisinopril 5 mg daily, and metoprolol 25 mg twice daily. -Hold home blood pressure regimen -Bolus IV fluids as needed to maintain maps greater than 65  Acute renal failure: Patient presents with creatinine of 3.55 with BUN 31.  Creatinine previously had been 1.13 on 12/26/2020.  Given patient's reports of decreased p.o. intake with nausea and vomiting suspect prerenal cause in addition to medications like furosemide. -Strict intake and output -Check urinalysis once able -IV fluids as noted above -Appreciate nephrology consultative services, we will follow-up for any further recommendation  Macrocytic anemia/: Acute on chronic.  On admission hemoglobin 7.6 g/dL with MCV 124.4 and MCH 38.6.  Previously hemoglobin had been 8.7 g/dL on 12/14.  Patient was typed and screened for possible need of blood products.  Question possibility of acute blood loss versus possibly related to prior chemo. -Continue to monitor H&H  -Ordering transfusion of 1 unit of packed red blood cells as blood counts are possibly lower  Acute metabolic encephalopathy: Patient's husband noted the patient to be acutely altered and not making sense last night for which he called EMS.  At this time patient appears to be alert and oriented to person and place.  Suspect likely related to severe dehydration and hypotension. -Neurochecks -Continue to monitor and determine need of further evaluation  Hypoxia: Acute.  On physical exam patient lung sounds relatively clear with crackles noted at the bases.  Chest x-ray noted no acute abnormality.  O2 saturations reported to be as low as  86% on room air and had been placed on 2 L nasal cannula temporarily, but currently maintaining O2 saturations on room air.  Of note she has a history of chronic pulmonary emboli, but has been on Eliquis. -Continuous pulse oximetry with nasal cannula oxygen maintain O2 saturation greater than 92% -Check phosphorus levels -May warrant further investigation if symptoms recur  Metabolic acidosis with anion gap  lactic acidosis: Acute.  On admission patient was noted to have CO2 of 20 with anion gap 17 and lactic acid 5.5.  Suspect lactic acidosis secondary to severe dehydration, but question possibility of underlying infection.  So far chest x-ray negative.  Blood cultures have been obtained. -Follow-up blood  cultures -Follow-up urinalysis -Trend lactic acid levels -Started on empiric antibiotics of vancomycin, cefepime, and metronidazole as patient is immunocompromised  Diabetes mellitus type 2: On admission CBG was in the 200s.  Home medication regimen includes Ozempic 1 mg q. weekly. -Hypoglycemic protocols -CBGs before every meal with very sensitive SSI  Endometrial adenocarcinoma on chemotherapy: Patient followed at oncology at [facility].   -Will likely need to continue outpatient follow-up with oncology in outpatient setting.  Hyperthyroidism: Chronic.  Patient on methimazole 10 mg daily in the outpatient setting. -Check TSH and free T4 -Resume metamizole when medically appropriate  Transaminitis: Acute.  AST 42, but previously had been within normal limits.  Suspect secondary to acute hypotension. -Continue to monitor  Anxiety/depression -Continue Prozac and olanzapine  Hyperlipidemia -Continue Crestor  History of DVT/PE on chronic anticoagulation: Patient has been on Eliquis and had been noted to have concern for chronic pulmonary emboli and from CT scan of the abdomen and pelvis with contrast from 08/31/2020. -Held Eliquis -Changed to heparin per pharmacy until able to rule  out possibility of bleeding   DVT prophylaxis: Heparin Code Status: Full Family Communication: Husband updated over the phone Disposition Plan: Hopefully discharge home once medically stable Consults called: PCCM, nephrology Admission status: Inpatient require more than 2 midnight stay in the setting of acute renal failure need of aggressive IV hydration  Norval Morton MD [facility]   If 7PM-7AM, please contact night-coverage   01/24/2021, 7:24 AM

## 2021-01-24 NOTE — ED Notes (Signed)
Pt to u/s

## 2021-01-25 LAB — APTT
aPTT: 28 seconds (ref 24–36)
aPTT: 80 seconds — ABNORMAL HIGH (ref 24–36)

## 2021-01-25 LAB — HEPARIN LEVEL (UNFRACTIONATED): Heparin Unfractionated: >1.1 IU/mL — ABNORMAL HIGH (ref 0.30–0.70)

## 2021-01-25 LAB — COMPREHENSIVE METABOLIC PANEL
ALT: 17 U/L (ref 0–44)
AST: 21 U/L (ref 15–41)
Albumin: 2.9 g/dL — ABNORMAL LOW (ref 3.5–5.0)
Alkaline Phosphatase: 46 U/L (ref 38–126)
Anion gap: 7 (ref 5–15)
BUN: 15 mg/dL (ref 8–23)
CO2: 22 mmol/L (ref 22–32)
Calcium: 8.9 mg/dL (ref 8.9–10.3)
Chloride: 112 mmol/L — ABNORMAL HIGH (ref 98–111)
Creatinine, Ser: 1.81 mg/dL — ABNORMAL HIGH (ref 0.44–1.00)
GFR, Estimated: 30 mL/min — ABNORMAL LOW (ref >60)
Glucose, Bld: 121 mg/dL — ABNORMAL HIGH (ref 70–99)
Potassium: 4.8 mmol/L (ref 3.5–5.1)
Sodium: 141 mmol/L (ref 135–145)
Total Bilirubin: 0.7 mg/dL (ref 0.3–1.2)
Total Protein: 5.4 g/dL — ABNORMAL LOW (ref 6.5–8.1)

## 2021-01-25 LAB — CBC
HCT: 22.2 % — ABNORMAL LOW (ref 36.0–46.0)
Hemoglobin: 7.4 g/dL — ABNORMAL LOW (ref 12.0–15.0)
MCH: 38.1 pg — ABNORMAL HIGH (ref 26.0–34.0)
MCHC: 33.3 g/dL (ref 30.0–36.0)
MCV: 114.4 fL — ABNORMAL HIGH (ref 80.0–100.0)
Platelets: 128 10*3/uL — ABNORMAL LOW (ref 150–400)
RBC: 1.94 MIL/uL — ABNORMAL LOW (ref 3.87–5.11)
RDW: 26.2 % — ABNORMAL HIGH (ref 11.5–15.5)
WBC: 7.2 10*3/uL (ref 4.0–10.5)
nRBC: 0.6 % — ABNORMAL HIGH (ref 0.0–0.2)

## 2021-01-25 LAB — GLUCOSE, CAPILLARY
Glucose-Capillary: 124 mg/dL — ABNORMAL HIGH (ref 70–99)
Glucose-Capillary: 115 mg/dL — ABNORMAL HIGH (ref 70–99)
Glucose-Capillary: 126 mg/dL — ABNORMAL HIGH (ref 70–99)
Glucose-Capillary: 119 mg/dL — ABNORMAL HIGH (ref 70–99)

## 2021-01-25 LAB — URINE CULTURE

## 2021-01-25 LAB — LACTIC ACID, PLASMA
Lactic Acid, Venous: 1.4 mmol/L (ref 0.5–1.9)
Lactic Acid, Venous: 2.2 mmol/L (ref 0.5–1.9)

## 2021-01-25 MED ORDER — LACTATED RINGERS IV SOLN: INTRAVENOUS | Status: DC

## 2021-01-25 MED ORDER — HEPARIN (PORCINE) 25000 UT/250ML-% IV SOLN
1300.0000 [IU]/h | INTRAVENOUS | Status: DC
  Administered 2021-01-25: 11:00:00 1200 [IU]/h via INTRAVENOUS
  Administered 2021-01-26: 1300 [IU]/h via INTRAVENOUS
  Filled 2021-01-25 (×2): qty 250

## 2021-01-25 NOTE — Progress Notes (Signed)
[facility] Progress Note    Assessment/Plan:   #Acute kidney injury likely hemodynamically mediated in the setting of hypotension concomitant with use of lisinopril/Lasix: Presented with nausea vomiting and decreased oral intake.  She was hypotensive in ER which was improved with IV fluid resuscitation.  Baseline creatinine level is around 0.9-1.1.  Bladder scan rule out urinary retention. I will check UA, kidney ultrasound,--> negative for obstruction She already received treatment of hyperkalemia--> this is normalized. Repeat renal panel, strict ins and out, continue IV fluid. Hold lisinopril, Lasix. No need for dialysis at this time. - Cr has drastically improved with IVF resuscitation and treatment of sepsis - expect further improvement- will sign off.  Call with questions   #Hyperkalemia in the setting of AKI and lisinopril use.  She received medical treatment in ER including IV fluid, insulin, dextrose, calcium, Lokelma, bicarbonate and Lasix.  - repeat K is normalized   #Hypotension presumably due to volume depletion concomitant with antihypertensives: Blood pressure improved after fluid resuscitation in ER.  Got antibiotics for presumed sepsis   #Anion gap metabolic acidosis, lactic acidosis: Work-up for infection/sepsis per medical team as patient is immunocompromised.  Received fluid, monitor lab.   #Endometrial cancer currently on chemotherapy at [place].  # Chronic pulm embolus- on Eliquis as OP, hep gtt here  # Anemia- got 1 u pRBCs yesterday.    Subjective:    Cr much improved and pt feeling better.  Husband and son at the bedside   Objective:   BP (!) 131/53 (BP Location: Right Arm)    Pulse 86    Temp 98.5 F (36.9 C) (Oral)    Resp 20    Ht 5\' 5"  (1.651 m)    Wt 98.8 kg    SpO2 96%    BMI 36.25 kg/m   Intake/Output Summary (Last 24 hours) at 01/25/2021 1540 Last data filed at 01/25/2021 1430 Gross per 24 hour  Intake 123 ml  Output 1070  ml  Net -947 ml   Weight change:   Physical Exam: Gen:NAD, + alopecia, sitting in bed CVS: RRR Resp: clear Abd: soft, nontender Ext: no LE edema  Imaging: US RENAL  Result Date: 01/24/2021 CLINICAL DATA:  Acute kidney injury EXAM: RENAL / URINARY TRACT ULTRASOUND COMPLETE COMPARISON:  None. FINDINGS: Right Kidney: Renal measurements: 9.8 x 5.3 x 5.1 cm = volume: 139.9 mL. Echogenicity within normal limits. No mass. Mild hydronephrosis visualized. Left Kidney: Renal measurements: 10.2 x 5.9 x 4.6 cm = volume: 143.8 mL. Echogenicity within normal limits. No mass or hydronephrosis visualized. Bladder: Appears normal for degree of bladder distention. Other: None. IMPRESSION: Mild right hydronephrosis. Electronically Signed   By: Macy Mis M.D.   On: 01/24/2021 10:28   DG Chest Portable 1 View  Result Date: 01/24/2021 CLINICAL DATA:  Shortness of breath and emesis EXAM: PORTABLE CHEST 1 VIEW COMPARISON:  08/31/2020 chest CT FINDINGS: Artifact from EKG leads. Enlarged heart size.  Negative mediastinal contours. Artifact from EKG leads. Porta catheter with tip at the right atrium. There is no edema, consolidation, effusion, or pneumothorax. IMPRESSION: No evidence of acute disease. Electronically Signed   By: Jorje Guild M.D.   On: 01/24/2021 04:47    Labs: BMET Recent Labs  Lab 01/24/21 0350 01/24/21 0650 01/25/21 0614  NA 139 142 141  K 7.0* 4.8 4.8  CL 102 105 112*  CO2 20* 20* 22  GLUCOSE 182* 212* 121*  BUN 30* 27* 15  CREATININE 3.55* 3.08* 1.81*  CALCIUM 9.3 8.6* 8.9  PHOS  --  4.3  --    CBC Recent Labs  Lab 01/24/21 0350 01/24/21 1015 01/24/21 1515 01/25/21 0614  WBC 9.6  --   --  7.2  NEUTROABS 5.2  --   --   --   HGB 7.6* 6.7* 7.4* 7.4*  HCT 24.5* 20.4* 23.9* 22.2*  MCV 124.4*  --   --  114.4*  PLT 155  --   --  128*    Medications:     Chlorhexidine Gluconate Cloth  6 each Topical Daily   darifenacin  15 mg Oral Daily   FLUoxetine  40 mg Oral  Daily   insulin aspart  0-6 Units Subcutaneous TID WC   OLANZapine  2.5 mg Oral QHS   rosuvastatin  30 mg Oral QPM   sodium chloride flush  10-40 mL Intracatheter Q12H   sodium chloride flush  3 mL Intravenous Q12H      Madelon Lips MD 01/25/2021, 3:40 PM

## 2021-01-25 NOTE — Progress Notes (Signed)
ANTICOAGULATION CONSULT NOTE- follow-up  Pharmacy Consult for Heparin drip Indication: pulmonary embolus  Allergies  Allergen Reactions   Other Hives    Sausage   Atenolol Other (See Comments)    Syncope, bradycardia (occurred in the setting of AKI) Syncope, bradycardia (occurred in the setting of AKI)    Lithium Other (See Comments)    Interacts with other meds that she takes   Metformin Nausea Only   Codeine Itching and Rash   Diazepam Itching and Rash   Fexofenadine Itching and Rash    Patient Measurements: Height: 5\' 5"  (165.1 cm) Weight: 98.8 kg (217 lb 13 oz) IBW/kg (Calculated) : 57 Heparin Dosing Weight: 79 kg  Vital Signs: Temp: 99.2 F (37.3 C) (01/13 1950) Temp Source: Oral (01/13 1950) BP: 105/55 (01/13 1950) Pulse Rate: 90 (01/13 1950)  Labs: Recent Labs    01/24/21 0318 01/24/21 0350 01/24/21 0350 01/24/21 0650 01/24/21 1015 01/24/21 1515 01/25/21 0614 01/25/21 1928  HGB  --  7.6*   < >  --  6.7* 7.4* 7.4*  --   HCT  --  24.5*   < >  --  20.4* 23.9* 22.2*  --   PLT  --  155  --   --   --   --  128*  --   APTT 27  --   --   --   --   --  28 80*  LABPROT  --  17.0*  --   --   --   --   --   --   INR  --  1.4*  --   --   --   --   --   --   HEPARINUNFRC  --   --   --   --   --   --  >1.10*  --   CREATININE  --  3.55*  --  3.08*  --   --  1.81*  --   TROPONINIHS  --  11  --  14  --   --   --   --    < > = values in this interval not displayed.     Estimated Creatinine Clearance: 35.6 mL/min (A) (by C-G formula based on SCr of 1.81 mg/dL (H)).   Medical History: Past Medical History:  Diagnosis Date   Diabetes mellitus type 2 in obese (HCC)    DVT (deep venous thrombosis) (HCC)    Endometrial cancer (HCC)    Hyperthyroidism     Medications:  Infusions:   heparin 1,200 Units/hr (01/25/21 1109)   lactated ringers 100 mL/hr at 01/25/21 1121    Assessment:  66 y.o. female with medical history significant of hypertension, DVT/PE on  Eliquis, endometrial adenocarcinoma s/p  chemotherapy, and hyperthyroidism presents with complaints of nausea and vomiting. Last dose of  Eliquis 1/11 PM per husband  Patient presented with bloody emesis on admission No current signs/symptoms of bleed  Heparin was dced yesterday before starting. It's being ordered again to start today. Her heparin level is elevated due to home apixaban.  Baseline PTT normal.  Goal of Therapy:  Heparin level 0.3-0.7 units/ml aPTT 66-102 seconds Monitor platelets by anticoagulation protocol: Yes   Plan:  Continue heparin 1200 units/hr (12 ml/hr). Next aPTT 01/26/2021 0200 Daily aPTT, heparin level, and CBC Monitor closely for signs/symptoms of bleed  Vaughan Basta BS, PharmD, BCPS Clinical Pharmacist 01/25/2021 8:23 PM

## 2021-01-25 NOTE — Care Management Important Message (Signed)
Important Message  Patient Details  Name: Becky Martin MRN: [id number] Date of Birth: 1955-06-10   Medicare Important Message Given:  Yes     Hannah Beat 01/25/2021, 3:33 PM

## 2021-01-25 NOTE — Progress Notes (Signed)
PROGRESS NOTE   Becky Martin  SFK:[id number]    DOB: [birth year]    DOA: 01/24/2021  PCP: Leonides Sake, MD   I have briefly reviewed patients previous medical records in [facility].  Chief Complaint  Patient presents with   Emesis    Pt from home with bloody emesis and low BP    Brief Narrative:  66 year old female with medical history significant for hypertension, DVT/PE on Eliquis, endometrial adenocarcinoma s/p chemotherapy, DM2 and hyperthyroidism, presented to the ED with complaints of nausea and vomiting.  She completed last of her 6 chemotherapy infusions of carboplatin and paclitaxel on 12/26/2020.  Since receiving chemotherapy, she has had nausea and vomiting.  Symptoms had usually improved with antiemetics.  However on day prior to admission, she started having intractable nausea and vomiting followed by confusion, malaise, palpitations and some difficulty breathing.  Also some bloody urine reported.  In the ED, hypotensive to 61/47 mmHg, hypoxic to 86% on room air and labs significant for hemoglobin 7.6, potassium 7, BUN 30, creatinine 3.55, anion gap 17, lactate 5.5.  PCCM consulted due to hypotension and signed off.  Nephrology consulted due to AKI and hyperkalemia.  Treated with cocktail for hyperkalemia.  Admitted for acute renal failure, hyperkalemia, transient hypotension, acute metabolic encephalopathy, hypoxia.  Improving.   Assessment & Plan:  Principal Problem:   Hyperkalemia Active Problems:   Acute renal failure (ARF) (HCC)   Hypoxia   Transient hypotension   Macrocytic anemia   Metabolic acidosis with increased anion gap and accumulation of organic acids   Type 2 diabetes mellitus with complication, without long-term current use of insulin (HCC)   Hyperthyroidism   Transaminitis   History of DVT (deep vein thrombosis)   Acute renal failure: Baseline creatinine 0.9-1.1.  Presented with creatinine of 3.35 and BUN of 31.  Likely related to dehydration  from GI losses, Lasix, lisinopril use and hemodynamic/hypotension.  Renal ultrasound with mild right hydronephrosis.  Treated with IV fluids and blood transfusion.  Creatinine has improved to 1.81.  Continue IV fluids and follow BMP in AM.  Expect complete recovery.  Consider repeating renal ultrasound in a couple of weeks to ensure resolution of hydronephrosis.  Avoid nephrotoxic's.  Hold lisinopril and Lasix.  Hyperkalemia: In the setting of acute renal failure and lisinopril use.  Treated in ED with IV fluids, insulin, dextrose, calcium gluconate, Lokelma, bicarbonate and Lasix.  Held lisinopril.  Resolved.  Anion gap metabolic acidosis/lactic acidosis: Secondary to acute renal failure and lactic acidosis.  Treatment as above.  Resolved.  Hypotension: Presented with BP of 61/47.  Secondary to volume depletion and antihypertensives.  Resolved after volume resuscitation.  Holding antihypertensives (metoprolol, lisinopril and Lasix).  Low clinical suspicion for infectious etiology for overall presentation, empirically started broad-spectrum antibiotics were discontinued.  Subacute on chronic anemia: Presented with hemoglobin of 7.6.  This dropped to 6.7 in the absence of overt bleeding.  S/p 1 unit PRBC and hemoglobin has improved to 7.4.  Per Care Everywhere, hemoglobin 8.7 on 12/14 and probably baseline hemoglobin is in the 8-9 range.  Chronic anemia likely related to chronic disease, malignancy and chemotherapy.  Acute anemia unclear etiology but no symptoms suggestive of overt bleeding.  Follow CBC in AM.  Thrombocytopenia: May be related to acute illness.  Follow CBC in AM.  Acute metabolic encephalopathy: Transient and resolved.  Likely related to dehydration, acute renal failure, hypotension and lactic acidosis.  Resolved.     Hypoxia: Acute.  Surgeon  saturations 86% on room air.  Chest x-ray without acute abnormality.  Low index of suspicion for PE.  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  Holding Eliquis temporarily but treating with IV heparin for now.  Resolved.    Diabetes mellitus type 2: Home Ozempic on hold.  Continue SSI.  Good inpatient control.   Endometrial adenocarcinoma on chemotherapy: Patient followed at oncology at [place].  Last chemotherapy was almost a month ago.  Had right anterior chest Port-A-Cath.  Outpatient follow-up with oncology.   Hyperthyroidism: Chronic.  Patient on methimazole 10 mg daily in the outpatient setting.  TSH 8.388.  Free T4 0.63.  Clinically euthyroid.  Defer to outpatient providers to adjust dose of methimazole.   Transaminitis: Acute.  Resolved.   Anxiety/depression: Continue Prozac and olanzapine   Hyperlipidemia: Continue Crestor   History of DVT/PE on chronic anticoagulation: Patient has been on Eliquis and had been noted to have concern for chronic pulmonary emboli and from CT scan of the abdomen and pelvis with contrast from 08/31/2020. Held Eliquis temporarily.  In the absence of clinical overt bleeding, started IV heparin and monitor closely.  If hemoglobin remained stable, transition to Eliquis in a.m.  Body mass index is 36.25 kg/m./Obesity    DVT prophylaxis: Place and maintain sequential compression device Start: 01/24/21 1142 IV heparin.   Code Status: Full Code Family Communication: Daughter-in-law at bedside. Disposition:  Status is: Inpatient  Remains inpatient appropriate because: Severity of illness        Consultants:   Nephrology  Procedures:     Antimicrobials:   All DC'ed   Subjective:  Seen this morning.  Daughter-in-law at bedside.  Overall feels much better, stronger.  Minimal dry intermittent cough but denies any other complaints.  No chest pain, dyspnea, overt bleeding.  Reports that she has chronic constipation and last BM was almost a week prior to admission.  Passing gas.  No abdominal pain.  As per RN, urine is normal straw-colored without overt hematuria.  As per DIL,  patient at baseline mental status.  Patient asking if she can go home.  Objective:   Vitals:   01/25/21 0013 01/25/21 0633 01/25/21 0743 01/25/21 1130  BP: 106/64 (!) 116/54 137/62 (!) 131/53  Pulse: 93 87 92 86  Resp: 20 (!) 22 (!) 22 20  Temp:   98.2 F (36.8 C) 98.5 F (36.9 C)  TempSrc:  Oral Oral Oral  SpO2: 96% 94% 95% 96%  Weight:  98.8 kg    Height:        General exam: Middle-age female, moderately built and obese sitting up comfortably in bed without distress. Respiratory system: Clear to auscultation. Respiratory effort normal.  Right upper anterior chest Port-A-Cath. Cardiovascular system: S1 & S2 heard, RRR. No JVD, murmurs, rubs, gallops or clicks. No pedal edema.  Telemetry personally reviewed: Sinus rhythm. Gastrointestinal system: Abdomen is nondistended, soft and nontender. No organomegaly or masses felt. Normal bowel sounds heard. Central nervous system: Alert and oriented. No focal neurological deficits. Extremities: Symmetric 5 x 5 power. Skin: No rashes, lesions or ulcers Psychiatry: Judgement and insight appear normal. Mood & affect appropriate.     Data Reviewed:   I have personally reviewed following labs and imaging studies   CBC: Recent Labs  Lab 01/24/21 0350 01/24/21 1015 01/24/21 1515 01/25/21 0614  WBC 9.6  --   --  7.2  NEUTROABS 5.2  --   --   --   HGB 7.6*  6.7* 7.4* 7.4*  HCT 24.5* 20.4* 23.9* 22.2*  MCV 124.4*  --   --  114.4*  PLT 155  --   --  128*    Basic Metabolic Panel: Recent Labs  Lab 01/24/21 0350 01/24/21 0650 01/24/21 1015 01/25/21 0614  NA 139 142  --  141  K 7.0* 4.8  --  4.8  CL 102 105  --  112*  CO2 20* 20*  --  22  GLUCOSE 182* 212*  --  121*  BUN 30* 27*  --  15  CREATININE 3.55* 3.08*  --  1.81*  CALCIUM 9.3 8.6*  --  8.9  MG  --   --  1.9  --   PHOS  --  4.3  --   --     Liver Function Tests: Recent Labs  Lab 01/24/21 0350 01/24/21 0650 01/25/21 0614  AST 42*  --  21  ALT 21  --  17   ALKPHOS 54  --  46  BILITOT 0.5  --  0.7  PROT 6.8  --  5.4*  ALBUMIN 3.6 3.3* 2.9*    CBG: Recent Labs  Lab 01/24/21 1640 01/25/21 0742 01/25/21 1152  GLUCAP 122* 124* 115*    Microbiology Studies:   Recent Results (from the past 240 hour(s))  Urine Culture     Status: Abnormal   Collection Time: 01/24/21  3:39 AM   Specimen: Urine, Clean Catch  Result Value Ref Range Status   Specimen Description URINE, CLEAN CATCH  Final   Special Requests   Final    NONE Performed at [facility], [place] [address] [place], [place] [address]    Culture MULTIPLE SPECIES PRESENT, SUGGEST RECOLLECTION (A)  Final   Report Status 01/25/2021 FINAL  Final  Resp Panel by RT-PCR (Flu A&B, Covid) Nasopharyngeal Swab     Status: None   Collection Time: 01/24/21  3:40 AM   Specimen: Nasopharyngeal Swab; Nasopharyngeal(NP) swabs in vial transport medium  Result Value Ref Range Status   SARS Coronavirus 2 by RT PCR NEGATIVE NEGATIVE Final    Comment: (NOTE) SARS-CoV-2 target nucleic acids are NOT DETECTED.  The SARS-CoV-2 RNA is generally detectable in upper respiratory specimens during the acute phase of infection. The lowest concentration of SARS-CoV-2 viral copies this assay can detect is 138 copies/mL. A negative result does not preclude SARS-Cov-2 infection and should not be used as the sole basis for treatment or other patient management decisions. A negative result may occur with  improper specimen collection/handling, submission of specimen other than nasopharyngeal swab, presence of viral mutation(s) within the areas targeted by this assay, and inadequate number of viral copies(<138 copies/mL). A negative result must be combined with clinical observations, patient history, and epidemiological information. The expected result is Negative.  Fact Sheet for Patients:  EntrepreneurPulse.com.au  Fact Sheet for Healthcare Providers:   IncredibleEmployment.be  This test is no t yet approved or cleared by the [place] FDA and  has been authorized for detection and/or diagnosis of SARS-CoV-2 by FDA under an Emergency Use Authorization (EUA). This EUA will remain  in effect (meaning this test can be used) for the duration of the COVID-19 declaration under Section 564(b)(1) of the Act, 21 U.S.C.section 360bbb-3(b)(1), unless the authorization is terminated  or revoked sooner.       Influenza A by PCR NEGATIVE NEGATIVE Final   Influenza B by PCR NEGATIVE NEGATIVE Final    Comment: (NOTE) The Xpert Xpress SARS-CoV-2/FLU/RSV plus  assay is intended as an aid in the diagnosis of influenza from Nasopharyngeal swab specimens and should not be used as a sole basis for treatment. Nasal washings and aspirates are unacceptable for Xpert Xpress SARS-CoV-2/FLU/RSV testing.  Fact Sheet for Patients: EntrepreneurPulse.com.au  Fact Sheet for Healthcare Providers: IncredibleEmployment.be  This test is not yet approved or cleared by the [place] FDA and has been authorized for detection and/or diagnosis of SARS-CoV-2 by FDA under an Emergency Use Authorization (EUA). This EUA will remain in effect (meaning this test can be used) for the duration of the COVID-19 declaration under Section 564(b)(1) of the Act, 21 U.S.C. section 360bbb-3(b)(1), unless the authorization is terminated or revoked.  Performed at [facility], [place] [address] [place], [place] [address]   Culture, blood (routine x 2)     Status: None (Preliminary result)   Collection Time: 01-26-2021  3:40 AM   Specimen: BLOOD LEFT HAND  Result Value Ref Range Status   Specimen Description BLOOD LEFT HAND  Final   Special Requests   Final    BOTTLES DRAWN AEROBIC AND ANAEROBIC Blood Culture results may not be optimal due to an excessive volume of blood received in culture bottles   Culture   Final     NO GROWTH 1 DAY Performed at [facility], [place] [address] [place], [place] [address]    Report Status PENDING  Incomplete  Culture, blood (routine x 2)     Status: None (Preliminary result)   Collection Time: 01-26-2021 10:15 AM   Specimen: BLOOD  Result Value Ref Range Status   Specimen Description BLOOD PORTA CATH  Final   Special Requests BAA Blood Culture adequate volume  Final   Culture   Final    NO GROWTH < 24 HOURS Performed at [facility], [place] [address], [place], [place] [address]    Report Status PENDING  Incomplete    Radiology Studies:  US RENAL  Result Date: 2021-01-26 CLINICAL DATA:  Acute kidney injury EXAM: RENAL / URINARY TRACT ULTRASOUND COMPLETE COMPARISON:  None. FINDINGS: Right Kidney: Renal measurements: 9.8 x 5.3 x 5.1 cm = volume: 139.9 mL. Echogenicity within normal limits. No mass. Mild hydronephrosis visualized. Left Kidney: Renal measurements: 10.2 x 5.9 x 4.6 cm = volume: 143.8 mL. Echogenicity within normal limits. No mass or hydronephrosis visualized. Bladder: Appears normal for degree of bladder distention. Other: None. IMPRESSION: Mild right hydronephrosis. Electronically Signed   By: Macy Mis M.D.   On: January 26, 2021 10:28   DG Chest Portable 1 View  Result Date: 01/26/21 CLINICAL DATA:  Shortness of breath and emesis EXAM: PORTABLE CHEST 1 VIEW COMPARISON:  08/31/2020 chest CT FINDINGS: Artifact from EKG leads. Enlarged heart size.  Negative mediastinal contours. Artifact from EKG leads. Porta catheter with tip at the right atrium. There is no edema, consolidation, effusion, or pneumothorax. IMPRESSION: No evidence of acute disease. Electronically Signed   By: Jorje Guild M.D.   On: 26-Jan-2021 04:47    Scheduled Meds:    Chlorhexidine Gluconate Cloth  6 each Topical Daily   darifenacin  15 mg Oral Daily   FLUoxetine  40 mg Oral Daily   insulin aspart  0-6 Units Subcutaneous TID WC   OLANZapine  2.5 mg Oral QHS    rosuvastatin  30 mg Oral QPM   sodium chloride flush  10-40 mL Intracatheter Q12H   sodium chloride flush  3 mL Intravenous Q12H    Continuous Infusions:  heparin 1,200 Units/hr (01/25/21 1109)   lactated ringers 100 mL/hr at 01/25/21 1121     LOS: 1 day     Vernell Leep, MD,  FACP, [facility], [facility] (Care Management Physician Certified) [place]  To contact the attending provider between 7A-7P or the covering provider during after hours 7P-7A, please log into the web site [email or web address] and access using universal [place] password for that web site. If you do not have the password, please call the hospital operator.  01/25/2021, 2:23 PM

## 2021-01-25 NOTE — Progress Notes (Signed)
[place] for Heparin drip Indication: pulmonary embolus  Allergies  Allergen Reactions   Other Hives    Sausage   Atenolol Other (See Comments)    Syncope, bradycardia (occurred in the setting of AKI) Syncope, bradycardia (occurred in the setting of AKI)    Lithium Other (See Comments)    Interacts with other meds that she takes   Metformin Nausea Only   Codeine Itching and Rash   Diazepam Itching and Rash   Fexofenadine Itching and Rash    Patient Measurements: Height: 5\' 5"  (165.1 cm) Weight: 98.8 kg (217 lb 13 oz) IBW/kg (Calculated) : 57 Heparin Dosing Weight: 79 kg  Vital Signs: Temp: 98.2 F (36.8 C) (01/13 0743) Temp Source: Oral (01/13 0743) BP: 137/62 (01/13 0743) Pulse Rate: 92 (01/13 0743)  Labs: Recent Labs    01/24/21 0318 01/24/21 0350 01/24/21 0350 01/24/21 0650 01/24/21 1015 01/24/21 1515 01/25/21 0614  HGB  --  7.6*   < >  --  6.7* 7.4* 7.4*  HCT  --  24.5*   < >  --  20.4* 23.9* 22.2*  PLT  --  155  --   --   --   --  128*  APTT 27  --   --   --   --   --  28  LABPROT  --  17.0*  --   --   --   --   --   INR  --  1.4*  --   --   --   --   --   HEPARINUNFRC  --   --   --   --   --   --  >1.10*  CREATININE  --  3.55*  --  3.08*  --   --  1.81*  TROPONINIHS  --  11  --  14  --   --   --    < > = values in this interval not displayed.     Estimated Creatinine Clearance: 35.6 mL/min (A) (by C-G formula based on SCr of 1.81 mg/dL (H)).   Medical History: Past Medical History:  Diagnosis Date   Diabetes mellitus type 2 in obese (HCC)    DVT (deep venous thrombosis) (HCC)    Endometrial cancer (HCC)    Hyperthyroidism     Medications:  Infusions:   heparin     lactated ringers      Assessment:  66 y.o. female with medical history significant of hypertension, DVT/PE on Eliquis, endometrial adenocarcinoma s/p  chemotherapy, and hyperthyroidism presents with complaints of nausea and vomiting.  Last dose of  Eliquis 1/11 PM per husband  Patient presented with bloody emesis on admission No current signs/symptoms of bleed  Heparin was dced yesterday before starting. It's being ordered again to start today. Her heparin level is elevated due to home apixaban.  Baseline PTT normal.  Goal of Therapy:  Heparin level 0.3-0.7 units/ml aPTT 66-102 seconds Monitor platelets by anticoagulation protocol: Yes   Plan:  Start heparin 1200 units/hr (12 ml/hr) with no bolus 2/2 recent apixaban use aPTT @1830  Daily aPTT, heparin level, and CBC Monitor closely for signs/symptoms of bleed  Onnie Boer, PharmD, BCIDP, AAHIVP, CPP Infectious Disease Pharmacist 01/25/2021 10:11 AM

## 2021-01-25 NOTE — Plan of Care (Signed)

## 2021-01-25 NOTE — Plan of Care (Signed)

## 2021-01-26 ENCOUNTER — Encounter (HOSPITAL_COMMUNITY): Payer: Self-pay | Admitting: Family Medicine

## 2021-01-26 LAB — CBC
HCT: 22.8 % — ABNORMAL LOW (ref 36.0–46.0)
Hemoglobin: 7.3 g/dL — ABNORMAL LOW (ref 12.0–15.0)
MCH: 37.2 pg — ABNORMAL HIGH (ref 26.0–34.0)
MCHC: 32 g/dL (ref 30.0–36.0)
MCV: 116.3 fL — ABNORMAL HIGH (ref 80.0–100.0)
Platelets: 131 10*3/uL — ABNORMAL LOW (ref 150–400)
RBC: 1.96 MIL/uL — ABNORMAL LOW (ref 3.87–5.11)
WBC: 7 10*3/uL (ref 4.0–10.5)
nRBC: 0.3 % — ABNORMAL HIGH (ref 0.0–0.2)

## 2021-01-26 LAB — BASIC METABOLIC PANEL
Anion gap: 8 (ref 5–15)
BUN: 11 mg/dL (ref 8–23)
CO2: 21 mmol/L — ABNORMAL LOW (ref 22–32)
Calcium: 9.2 mg/dL (ref 8.9–10.3)
Chloride: 114 mmol/L — ABNORMAL HIGH (ref 98–111)
Creatinine, Ser: 1.52 mg/dL — ABNORMAL HIGH (ref 0.44–1.00)
GFR, Estimated: 38 mL/min — ABNORMAL LOW (ref >60)
Glucose, Bld: 114 mg/dL — ABNORMAL HIGH (ref 70–99)
Potassium: 4.4 mmol/L (ref 3.5–5.1)
Sodium: 143 mmol/L (ref 135–145)

## 2021-01-26 LAB — GLUCOSE, CAPILLARY
Glucose-Capillary: 110 mg/dL — ABNORMAL HIGH (ref 70–99)
Glucose-Capillary: 107 mg/dL — ABNORMAL HIGH (ref 70–99)
Glucose-Capillary: 119 mg/dL — ABNORMAL HIGH (ref 70–99)
Glucose-Capillary: 128 mg/dL — ABNORMAL HIGH (ref 70–99)

## 2021-01-26 LAB — PREPARE RBC (CROSSMATCH)

## 2021-01-26 LAB — HEPARIN LEVEL (UNFRACTIONATED): Heparin Unfractionated: >1.1 IU/mL — ABNORMAL HIGH (ref 0.30–0.70)

## 2021-01-26 LAB — RETICULOCYTES
Immature Retic Fract: 37.5 % — ABNORMAL HIGH (ref 2.3–15.9)
RBC.: 1.95 MIL/uL — ABNORMAL LOW (ref 3.87–5.11)
Retic Count, Absolute: 57.3 10*3/uL (ref 19.0–186.0)
Retic Ct Pct: 2.9 % (ref 0.4–3.1)

## 2021-01-26 LAB — APTT: aPTT: 58 seconds — ABNORMAL HIGH (ref 24–36)

## 2021-01-26 MED ORDER — APIXABAN 5 MG PO TABS
5.0000 mg | ORAL_TABLET | Freq: Two times a day (BID) | ORAL | Status: DC
  Administered 2021-01-26 – 2021-01-27 (×2): 5 mg via ORAL
  Filled 2021-01-26 (×2): qty 1

## 2021-01-26 MED ORDER — SODIUM CHLORIDE 0.9% IV SOLUTION: Freq: Once | INTRAVENOUS | Status: AC

## 2021-01-26 MED ORDER — POLYETHYLENE GLYCOL 3350 17 G PO PACK
17.0000 g | PACK | Freq: Two times a day (BID) | ORAL | Status: DC
  Administered 2021-01-26 – 2021-01-27 (×3): 17 g via ORAL
  Filled 2021-01-26 (×3): qty 1

## 2021-01-26 MED ORDER — SENNOSIDES-DOCUSATE SODIUM 8.6-50 MG PO TABS
1.0000 | ORAL_TABLET | Freq: Two times a day (BID) | ORAL | Status: DC
  Administered 2021-01-26 – 2021-01-27 (×3): 1 via ORAL
  Filled 2021-01-26 (×3): qty 1

## 2021-01-26 MED ORDER — BISACODYL 10 MG RE SUPP
10.0000 mg | Freq: Once | RECTAL | Status: AC
  Administered 2021-01-26: 10 mg via RECTAL
  Filled 2021-01-26: qty 1

## 2021-01-26 MED ORDER — LACTATED RINGERS IV SOLN: INTRAVENOUS | Status: DC

## 2021-01-26 NOTE — Progress Notes (Signed)
[place] for Heparin Indication: pulmonary embolus Brief A/P: aPTT subtherapeutic  Increase Heparin rate  Allergies  Allergen Reactions   Other Hives    Sausage   Atenolol Other (See Comments)    Syncope, bradycardia (occurred in the setting of AKI) Syncope, bradycardia (occurred in the setting of AKI)    Lithium Other (See Comments)    Interacts with other meds that she takes   Metformin Nausea Only   Codeine Itching and Rash   Diazepam Itching and Rash   Fexofenadine Itching and Rash    Patient Measurements: Height: 5\' 5"  (165.1 cm) Weight: 98.8 kg (217 lb 13 oz) IBW/kg (Calculated) : 57 Heparin Dosing Weight: 79 kg  Vital Signs: Temp: 99.2 F (37.3 C) (01/13 1950) Temp Source: Oral (01/13 1950) BP: 119/49 (01/13 2224) Pulse Rate: 91 (01/13 2224)  Labs: Recent Labs    01/24/21 0350 01/24/21 0650 01/24/21 1015 01/24/21 1515 01/25/21 0614 01/25/21 1928 01/26/21 0229  HGB 7.6*  --    < > 7.4* 7.4*  --  7.3*  HCT 24.5*  --    < > 23.9* 22.2*  --  22.8*  PLT 155  --   --   --  128*  --  131*  APTT  --   --   --   --  28 80* 58*  LABPROT 17.0*  --   --   --   --   --   --   INR 1.4*  --   --   --   --   --   --   HEPARINUNFRC  --   --   --   --  >1.10*  --  >1.10*  CREATININE 3.55* 3.08*  --   --  1.81*  --  1.52*  TROPONINIHS 11 14  --   --   --   --   --    < > = values in this interval not displayed.     Estimated Creatinine Clearance: 42.4 mL/min (A) (by C-G formula based on SCr of 1.52 mg/dL (H)).   Assessment: 66 y.o. female with h/o VTE, Eliquis on hold, for heparin  Goal of Therapy:  Heparin level 0.3-0.7 units/ml aPTT 66-102 seconds Monitor platelets by anticoagulation protocol: Yes   Plan:  Increase Heparin 1300 units/hr  Phillis Knack, PharmD, BCPS  01/26/2021 3:20 AM

## 2021-01-26 NOTE — Progress Notes (Addendum)
PROGRESS NOTE   Becky Martin  LNL:[id number]    DOB: 1955/09/29    DOA: 01/24/2021  PCP: Leonides Sake, MD   I have briefly reviewed patients previous medical records in [facility].  Chief Complaint  Patient presents with   Emesis    Pt from home with bloody emesis and low BP    Brief Narrative:  66 year old female with medical history significant for hypertension, DVT/PE on Eliquis, endometrial adenocarcinoma s/p chemotherapy, DM2 and hyperthyroidism, presented to the ED with complaints of nausea and vomiting.  She completed last of her 6 chemotherapy infusions of carboplatin and paclitaxel on 12/26/2020.  Since receiving chemotherapy, she has had nausea and vomiting.  Symptoms had usually improved with antiemetics.  However on day prior to admission, she started having intractable nausea and vomiting followed by confusion, malaise, palpitations and some difficulty breathing.  Also some bloody urine reported.  In the ED, hypotensive to 61/47 mmHg, hypoxic to 86% on room air and labs significant for hemoglobin 7.6, potassium 7, BUN 30, creatinine 3.55, anion gap 17, lactate 5.5.  PCCM consulted due to hypotension and signed off.  Nephrology consulted due to AKI and hyperkalemia.  Treated with cocktail for hyperkalemia.  Admitted for acute renal failure, hyperkalemia, transient hypotension, acute metabolic encephalopathy, hypoxia.  Improving.   Assessment & Plan:  Principal Problem:   Hyperkalemia Active Problems:   Acute renal failure (ARF) (HCC)   Hypoxia   Transient hypotension   Macrocytic anemia   Metabolic acidosis with increased anion gap and accumulation of organic acids   Type 2 diabetes mellitus with complication, without long-term current use of insulin (HCC)   Hyperthyroidism   Transaminitis   History of DVT (deep vein thrombosis)   Acute renal failure: Baseline creatinine 0.9-1.1.  Presented with creatinine of 3.35 and BUN of 31.  Likely related to dehydration  from GI losses, Lasix, lisinopril use and hemodynamic/hypotension.  Renal ultrasound with mild right hydronephrosis.  Treated with IV fluids and blood transfusion.  Creatinine has improved to 1.81>1.5.  Continue IV fluids for additional 24 hours and follow BMP in AM.  Expect complete recovery.  Consider repeating renal ultrasound in a couple of weeks to ensure resolution of hydronephrosis.  Avoid nephrotoxic's.  Hold lisinopril and Lasix.  Hyperkalemia: In the setting of acute renal failure and lisinopril use.  Treated in ED with IV fluids, insulin, dextrose, calcium gluconate, Lokelma, bicarbonate and Lasix.  Held lisinopril.  Resolved.  Anion gap metabolic acidosis/lactic acidosis: Secondary to acute renal failure and lactic acidosis.  Treatment as above.  Resolved.  Hypotension/hypertension: Presented with BP of 61/47.  Secondary to volume depletion and antihypertensives.  Resolved after volume resuscitation.  Holding antihypertensives (metoprolol, lisinopril and Lasix).  Low clinical suspicion for infectious etiology for overall presentation, empirically started broad-spectrum antibiotics were discontinued.  Blood pressures currently controlled off antihypertensives.  Subacute on chronic anemia: Presented with hemoglobin of 7.6.  This dropped to 6.7 in the absence of overt bleeding.  S/p 1 unit PRBC and hemoglobin has improved to 7.4 >7.3.  Per Care Everywhere, hemoglobin 8.7 on 12/14 and probably baseline hemoglobin is in the 8-9 range.  Chronic anemia likely related to chronic disease, malignancy and chemotherapy.  Acute anemia unclear etiology but no symptoms suggestive of overt bleeding.  Patient somewhat symptomatic of anemia, lightheadedness on sitting up at times, transfuse second unit PRBC 1/14, patient consented.  Given macrocytosis, checking anemia panel.  Thrombocytopenia: May be related to acute illness.  Improving.  Follow CBC in AM.  Acute metabolic encephalopathy: Transient and  resolved.  Likely related to dehydration, acute renal failure, hypotension and lactic acidosis.  Resolved.     Hypoxia: Acute.  Surgeon saturations 86% on room air.  Chest x-ray without acute abnormality.  Low index of suspicion for PE.  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  Resolved.    Diabetes mellitus type 2: Home Ozempic on hold.  Continue SSI.  Good inpatient control.   Endometrial adenocarcinoma on chemotherapy: Patient followed at oncology at [place].  Last chemotherapy was almost a month ago.  Had right anterior chest Port-A-Cath.  Outpatient follow-up with oncology.  Patient states that she has completed course of chemotherapy.   Hyperthyroidism: Chronic.  Patient on methimazole 10 mg daily in the outpatient setting.  TSH 8.388.  Free T4 0.63.  Clinically euthyroid.  Defer to outpatient providers to adjust dose of methimazole.   Transaminitis: Acute.  Resolved.   Anxiety/depression: Continue Prozac and olanzapine   Hyperlipidemia: Continue Crestor   History of DVT/PE on chronic anticoagulation: Patient has been on Eliquis and had been noted to have concern for chronic pulmonary emboli and from CT scan of the abdomen and pelvis with contrast from 08/31/2020. Held Eliquis temporarily.  In the absence of clinical overt bleeding, started IV heparin and monitor closely.  Transitioned back from IV heparin to Eliquis on 1/14.  Constipation: No BM in about a week's time.  Passing flatus.  Initiated bowel regimen.   Body mass index is 36.25 kg/m./Obesity    DVT prophylaxis: Place and maintain sequential compression device Start: 01/24/21 1142 IV heparin.   Code Status: Full Code Family Communication: Spouse via phone. Disposition:  Status is: Inpatient  Remains inpatient appropriate because: Severity of illness        Consultants:   Nephrology  Procedures:     Antimicrobials:   All DC'ed   Subjective:  Reports that her husband may have noticed some  hematuria PTA but none since admission.  No other bleeding noted.  No chest pain or dyspnea.  Objective:   Vitals:   01/26/21 0736 01/26/21 0737 01/26/21 1135 01/26/21 1228  BP:  132/82 (!) 157/83 136/70  Pulse:  88 100 88  Resp:  20 (!) 21 17  Temp: 99.2 F (37.3 C) 99.2 F (37.3 C) 98.3 F (36.8 C) 98.3 F (36.8 C)  TempSrc: Oral Oral Oral Oral  SpO2:  94% 98% 96%  Weight:      Height:        General exam: Middle-age female, moderately built and obese sitting up comfortably in bed without distress. Respiratory system: Clear to auscultation.  No increased work of breathing.  Right upper anterior chest Port-A-Cath. Cardiovascular system: S1 and S2 heard, RRR.  No JVD, murmurs or pedal edema.  Telemetry personally reviewed: Sinus rhythm. Gastrointestinal system: Abdomen is nondistended, soft and nontender. No organomegaly or masses felt. Normal bowel sounds heard. Central nervous system: Alert and oriented. No focal neurological deficits. Extremities: Symmetric 5 x 5 power. Skin: No rashes, lesions or ulcers Psychiatry: Judgement and insight appear normal. Mood & affect appropriate.     Data Reviewed:   I have personally reviewed following labs and imaging studies   CBC: Recent Labs  Lab 01/24/21 0350 01/24/21 1015 01/24/21 1515 01/25/21 0614 01/26/21 0229  WBC 9.6  --   --  7.2 7.0  NEUTROABS 5.2  --   --   --   --   HGB  7.6*   < > 7.4* 7.4* 7.3*  HCT 24.5*   < > 23.9* 22.2* 22.8*  MCV 124.4*  --   --  114.4* 116.3*  PLT 155  --   --  128* 131*   < > = values in this interval not displayed.    Basic Metabolic Panel: Recent Labs  Lab 01/24/21 0350 01/24/21 0650 01/24/21 1015 01/25/21 0614 01/26/21 0229  NA 139 142  --  141 143  K 7.0* 4.8  --  4.8 4.4  CL 102 105  --  112* 114*  CO2 20* 20*  --  22 21*  GLUCOSE 182* 212*  --  121* 114*  BUN 30* 27*  --  15 11  CREATININE 3.55* 3.08*  --  1.81* 1.52*  CALCIUM 9.3 8.6*  --  8.9 9.2  MG  --   --  1.9   --   --   PHOS  --  4.3  --   --   --     Liver Function Tests: Recent Labs  Lab 01/24/21 0350 01/24/21 0650 01/25/21 0614  AST 42*  --  21  ALT 21  --  17  ALKPHOS 54  --  46  BILITOT 0.5  --  0.7  PROT 6.8  --  5.4*  ALBUMIN 3.6 3.3* 2.9*    CBG: Recent Labs  Lab 01/25/21 2227 01/26/21 0739 01/26/21 1152  GLUCAP 119* 110* 107*    Microbiology Studies:   Recent Results (from the past 240 hour(s))  Urine Culture     Status: Abnormal   Collection Time: 01/24/21  3:39 AM   Specimen: Urine, Clean Catch  Result Value Ref Range Status   Specimen Description URINE, CLEAN CATCH  Final   Special Requests   Final    NONE Performed at [facility], [place] [address], [place], [place] [address]    Culture MULTIPLE SPECIES PRESENT, SUGGEST RECOLLECTION (A)  Final   Report Status 01/25/2021 FINAL  Final  Resp Panel by RT-PCR (Flu A&B, Covid) Nasopharyngeal Swab     Status: None   Collection Time: 01/24/21  3:40 AM   Specimen: Nasopharyngeal Swab; Nasopharyngeal(NP) swabs in vial transport medium  Result Value Ref Range Status   SARS Coronavirus 2 by RT PCR NEGATIVE NEGATIVE Final    Comment: (NOTE) SARS-CoV-2 target nucleic acids are NOT DETECTED.  The SARS-CoV-2 RNA is generally detectable in upper respiratory specimens during the acute phase of infection. The lowest concentration of SARS-CoV-2 viral copies this assay can detect is 138 copies/mL. A negative result does not preclude SARS-Cov-2 infection and should not be used as the sole basis for treatment or other patient management decisions. A negative result may occur with  improper specimen collection/handling, submission of specimen other than nasopharyngeal swab, presence of viral mutation(s) within the areas targeted by this assay, and inadequate number of viral copies(<138 copies/mL). A negative result must be combined with clinical observations, patient history, and epidemiological information. The  expected result is Negative.  Fact Sheet for Patients:  EntrepreneurPulse.com.au  Fact Sheet for Healthcare Providers:  IncredibleEmployment.be  This test is no t yet approved or cleared by the [place] FDA and  has been authorized for detection and/or diagnosis of SARS-CoV-2 by FDA under an Emergency Use Authorization (EUA). This EUA will remain  in effect (meaning this test can be used) for the duration of the COVID-19 declaration under Section 564(b)(1) of the Act, 21 U.S.C.section 360bbb-3(b)(1), unless the authorization  is terminated  or revoked sooner.       Influenza A by PCR NEGATIVE NEGATIVE Final   Influenza B by PCR NEGATIVE NEGATIVE Final    Comment: (NOTE) The Xpert Xpress SARS-CoV-2/FLU/RSV plus assay is intended as an aid in the diagnosis of influenza from Nasopharyngeal swab specimens and should not be used as a sole basis for treatment. Nasal washings and aspirates are unacceptable for Xpert Xpress SARS-CoV-2/FLU/RSV testing.  Fact Sheet for Patients: EntrepreneurPulse.com.au  Fact Sheet for Healthcare Providers: IncredibleEmployment.be  This test is not yet approved or cleared by the [place] FDA and has been authorized for detection and/or diagnosis of SARS-CoV-2 by FDA under an Emergency Use Authorization (EUA). This EUA will remain in effect (meaning this test can be used) for the duration of the COVID-19 declaration under Section 564(b)(1) of the Act, 21 U.S.C. section 360bbb-3(b)(1), unless the authorization is terminated or revoked.  Performed at [facility], [place] [address] [place], [place] [address]   Culture, blood (routine x 2)     Status: None (Preliminary result)   Collection Time: 01/24/21  3:40 AM   Specimen: BLOOD LEFT HAND  Result Value Ref Range Status   Specimen Description BLOOD LEFT HAND  Final   Special Requests   Final    BOTTLES DRAWN  AEROBIC AND ANAEROBIC Blood Culture results may not be optimal due to an excessive volume of blood received in culture bottles   Culture   Final    NO GROWTH 1 DAY Performed at [facility], [place] [address], [place], [place] [address]    Report Status PENDING  Incomplete  Culture, blood (routine x 2)     Status: None (Preliminary result)   Collection Time: 01/24/21 10:15 AM   Specimen: BLOOD  Result Value Ref Range Status   Specimen Description BLOOD PORTA CATH  Final   Special Requests BAA Blood Culture adequate volume  Final   Culture   Final    NO GROWTH < 24 HOURS Performed at [facility], [place] [address], [place], [place] [address]    Report Status PENDING  Incomplete    Radiology Studies:  No results found.  Scheduled Meds:    Chlorhexidine Gluconate Cloth  6 each Topical Daily   darifenacin  15 mg Oral Daily   FLUoxetine  40 mg Oral Daily   insulin aspart  0-6 Units Subcutaneous TID WC   OLANZapine  2.5 mg Oral QHS   polyethylene glycol  17 g Oral BID   rosuvastatin  30 mg Oral QPM   senna-docusate  1 tablet Oral BID   sodium chloride flush  10-40 mL Intracatheter Q12H   sodium chloride flush  3 mL Intravenous Q12H    Continuous Infusions:    heparin 1,300 Units/hr (01/26/21 5284)   lactated ringers       LOS: 2 days     Vernell Leep, MD,  FACP, [facility], [facility], [facility] (Care Management Physician Certified) [place]  To contact the attending provider between 7A-7P or the covering provider during after hours 7P-7A, please log into the web site [email or web address] and access using universal [place] password for that web site. If you do not have the password, please call the hospital operator.  01/26/2021, 2:32 PM

## 2021-01-26 NOTE — Progress Notes (Signed)
Physical Therapy Discharge Patient Details Name: Becky Martin MRN: [id number] DOB: 06/22/1955 Today's Date: 01/26/2021 Time: 2010-0712 PT Time Calculation (min) (ACUTE ONLY): 27 min  Patient discharged from PT services secondary to  pt refusing need for further services . Please reconsult if needed or if pt changes her mind.  Please see latest therapy progress note for current level of functioning and progress toward goals.    Progress and discharge plan discussed with patient and/or caregiver: Patient/Caregiver agrees with plan  GP   [name] A. Italia Wolfert, PT, DPT Acute Rehabilitation Services Office: [place] 01/26/2021, 3:08 PM

## 2021-01-26 NOTE — Progress Notes (Signed)
Rec'd new order from pharmacy. aPtt low, increase Heparin to 13/hr. Order acknowledged and change made on IV pump.

## 2021-01-26 NOTE — Evaluation (Signed)
Physical Therapy Evaluation Patient Details Name: Becky Martin MRN: [id number] DOB: 04-Oct-1955 Today's Date: 01/26/2021  History of Present Illness  Pt. is 66 yr old F admitted on 01/24/21 with hematemesis and hypotension.  PMH: DM, DVT, endometrial CA on chemo  Clinical Impression  Pt is typically independent with functional mobility but is currently requiring min A for transfers and CGA for ambulation with use of a RW.  Despite change in LE strength and balance from baseline, pt is reporting not wanting PT in acute care or in her home.  Spouse is present and supports pt's decision.  Pt will be D/C from caseload at this time but please reconsult if her status changes.       Recommendations for follow up therapy are one component of a multi-disciplinary discharge planning process, led by the attending physician.  Recommendations may be updated based on patient status, additional functional criteria and insurance authorization.  Follow Up Recommendations Other (comment) (Recommended HH PT but pt. refuses at this time.)    Assistance Recommended at Discharge Intermittent Supervision/Assistance  Patient can return home with the following  A little help with walking and/or transfers;A little help with bathing/dressing/bathroom;Assistance with cooking/housework;Assist for transportation;Help with stairs or ramp for entrance    Equipment Recommendations Rolling walker (2 wheels) (Encouraged to use RW at home)  Recommendations for Other Services       Functional Status Assessment Patient has had a recent decline in their functional status and demonstrates the ability to make significant improvements in function in a reasonable and predictable amount of time.     Precautions / Restrictions Precautions Precautions: Fall      Mobility  Bed Mobility Overal bed mobility: Modified Independent             General bed mobility comments: Able to sit up EOB and transfer BTB with use of  bedrail. Patient Response: Cooperative  Transfers Overall transfer level: Needs assistance Equipment used: Rolling walker (2 wheels);1 person hand held assist Transfers: Sit to/from Stand Sit to Stand: Min assist           General transfer comment: 1st attempt wants to stand without RW - req's 2 HHA and 2 attempts to complete with min A.  Demos good standing balance.  2nd attempt stands with a RW and req's VC for safety with hand placement.  Able to stand with CGA.  Pt. does not want to amb, states she does not need PT but NSG states pt. was shaky earlier and is able to assist PT in encouraging pt to amb around room.    Ambulation/Gait Ambulation/Gait assistance: Min guard Gait Distance (Feet): 20 Feet Assistive device: Rolling walker (2 wheels) Gait Pattern/deviations: Shuffle;Decreased step length - right;Decreased step length - left       General Gait Details: Pt. amb to door of room and back with use of RW and CGA.  No LOB with activity.  Does demo dec foot clearance and short step length.  Stairs            Wheelchair Mobility    Modified Rankin (Stroke Patients Only)       Balance Overall balance assessment: Mild deficits observed, not formally tested                                           Pertinent Vitals/Pain Pain Assessment: 0-10 Pain Score: 0-No  pain    Home Living Family/patient expects to be discharged to:: Private residence Living Arrangements: Spouse/significant other;Children Available Help at Discharge: Family;Available 24 hours/day Type of Home: House Home Access: Stairs to enter Entrance Stairs-Rails: None Entrance Stairs-Number of Steps: 5   Home Layout: One level Home Equipment: Conservation officer, nature (2 wheels);Cane - single point Additional Comments: States she has RW/SPC if needed but does not typically use them    Prior Function Prior Level of Function : Independent/Modified Independent               ADLs  Comments: States she doesn't need help, but that her huband helps her anyway     Hand Dominance        Extremity/Trunk Assessment   Upper Extremity Assessment Upper Extremity Assessment: Overall WFL for tasks assessed    Lower Extremity Assessment Lower Extremity Assessment: Generalized weakness       Communication   Communication: No difficulties  Cognition Arousal/Alertness: Awake/alert Behavior During Therapy: WFL for tasks assessed/performed Overall Cognitive Status: Within Functional Limits for tasks assessed                                 General Comments: Supine in bed when PT arrives.  Spouse and son in room.  Alert and oriented x 3. States she's been getting up to [facility] fine and does not need PT.        General Comments General comments (skin integrity, edema, etc.): Discussed HH PT but pt refuses, states she does not need it.    Exercises     Assessment/Plan    PT Assessment Patient does not need any further PT services (Pt refusing further PT at this time.)  PT Problem List         PT Treatment Interventions      PT Goals (Current goals can be found in the Care Plan section)  Acute Rehab PT Goals Patient Stated Goal: Pt's goal is to return home PT Goal Formulation: With patient Time For Goal Achievement: 02/09/21 Potential to Achieve Goals: Good    Frequency       Co-evaluation               AM-PAC PT "6 Clicks" Mobility  Outcome Measure Help needed turning from your back to your side while in a flat bed without using bedrails?: A Little Help needed moving from lying on your back to sitting on the side of a flat bed without using bedrails?: A Little Help needed moving to and from a bed to a chair (including a wheelchair)?: A Little Help needed standing up from a chair using your arms (e.g., wheelchair or bedside chair)?: A Little Help needed to walk in hospital room?: A Little Help needed climbing 3-5 steps with a railing?  : A Little 6 Click Score: 18    End of Session Equipment Utilized During Treatment: Gait belt Activity Tolerance: Patient tolerated treatment well Patient left: in bed;with call bell/phone within reach;with family/visitor present Nurse Communication: Mobility status PT Visit Diagnosis: History of falling (Z91.81)    Time: 2094-7096 PT Time Calculation (min) (ACUTE ONLY): 27 min   Charges:   PT Evaluation $PT Eval Low Complexity: 1 Low PT Treatments $Therapeutic Activity: 8-22 mins        Story Vanvranken A. Damita Eppard, PT, DPT Acute Rehabilitation Services Office: [place] 01/26/2021, 3:04 PM

## 2021-01-27 LAB — BASIC METABOLIC PANEL
Anion gap: 10 (ref 5–15)
BUN: 9 mg/dL (ref 8–23)
CO2: 19 mmol/L — ABNORMAL LOW (ref 22–32)
Calcium: 9.3 mg/dL (ref 8.9–10.3)
Chloride: 115 mmol/L — ABNORMAL HIGH (ref 98–111)
Creatinine, Ser: 1.37 mg/dL — ABNORMAL HIGH (ref 0.44–1.00)
GFR, Estimated: 43 mL/min — ABNORMAL LOW (ref >60)
Glucose, Bld: 110 mg/dL — ABNORMAL HIGH (ref 70–99)
Potassium: 4.5 mmol/L (ref 3.5–5.1)
Sodium: 144 mmol/L (ref 135–145)

## 2021-01-27 LAB — BPAM RBC
Blood Product Expiration Date: 202301142359
Blood Product Expiration Date: 202302072359
ISSUE DATE / TIME: 202301121107
ISSUE DATE / TIME: 202301141157
Unit Type and Rh: 6200
Unit Type and Rh: 9500

## 2021-01-27 LAB — TYPE AND SCREEN
ABO/RH(D): A POS
Antibody Screen: NEGATIVE
Unit division: 0
Unit division: 0

## 2021-01-27 LAB — CBC
HCT: 27.7 % — ABNORMAL LOW (ref 36.0–46.0)
Hemoglobin: 9.3 g/dL — ABNORMAL LOW (ref 12.0–15.0)
MCH: 35.8 pg — ABNORMAL HIGH (ref 26.0–34.0)
MCHC: 33.6 g/dL (ref 30.0–36.0)
MCV: 106.5 fL — ABNORMAL HIGH (ref 80.0–100.0)
Platelets: 135 10*3/uL — ABNORMAL LOW (ref 150–400)
RBC: 2.6 MIL/uL — ABNORMAL LOW (ref 3.87–5.11)
RDW: 25.2 % — ABNORMAL HIGH (ref 11.5–15.5)
WBC: 6 10*3/uL (ref 4.0–10.5)
nRBC: 0.3 % — ABNORMAL HIGH (ref 0.0–0.2)

## 2021-01-27 LAB — GLUCOSE, CAPILLARY
Glucose-Capillary: 112 mg/dL — ABNORMAL HIGH (ref 70–99)
Glucose-Capillary: 122 mg/dL — ABNORMAL HIGH (ref 70–99)
Glucose-Capillary: 108 mg/dL — ABNORMAL HIGH (ref 70–99)

## 2021-01-27 MED ORDER — POLYETHYLENE GLYCOL 3350 17 G PO PACK: 17.0000 g | PACK | Freq: Every day | ORAL | 0 refills | Status: DC

## 2021-01-27 MED ORDER — HEPARIN SOD (PORK) LOCK FLUSH 100 UNIT/ML IV SOLN
500.0000 [IU] | INTRAVENOUS | Status: AC | PRN
  Administered 2021-01-27: 500 [IU]
  Filled 2021-01-27: qty 5

## 2021-01-27 MED ORDER — ROSUVASTATIN CALCIUM 20 MG PO TABS: 30.0000 mg | ORAL_TABLET | Freq: Every day | ORAL | Status: DC

## 2021-01-27 MED ORDER — SENNOSIDES-DOCUSATE SODIUM 8.6-50 MG PO TABS: 1.0000 | ORAL_TABLET | Freq: Every evening | ORAL | 0 refills | Status: DC | PRN

## 2021-01-27 NOTE — TOC Transition Note (Signed)
Transition of Care [facility]) - CM/SW Discharge Note   Patient Details  Name: Becky Martin MRN: [id number] Date of Birth: Feb 21, 1955  Transition of Care [facility]) CM/SW Contact:  Bartholomew Crews, RN Phone Number: [phone] 01/27/2021, 10:52 AM   Clinical Narrative:     Spoke with patient on hospital room phone. Discussed recommendations for DME and HH. Patient stated that she already has a walker at home. Patient declines [facility] at this time stating that she wants to do for herself and not depend on anyone else. Educated patient about the role of [facility]. Patient verbalized understanding, but continues to decline. Advised that PCP can also refer to [facility] if she changes her mind at a later time. Patient verbalized understanding. No further TOC needs identified at this time.   Final next level of care: Home/Self Care Barriers to Discharge: No Barriers Identified   Patient Goals and CMS Choice Patient states their goals for this hospitalization and ongoing recovery are:: return home with husband CMS Medicare.gov Compare Post Acute Care list provided to:: Patient Choice offered to / list presented to : Patient  Discharge Placement                       Discharge Plan and Services                DME Arranged: N/A DME Agency: NA       HH Arranged: Refused [facility] Agency: NA        Social Determinants of Health (SDOH) Interventions     Readmission Risk Interventions No flowsheet data found.

## 2021-01-27 NOTE — Discharge Summary (Signed)
Physician Discharge Summary  Becky Martin JIR:[id number] DOB: 11/24/1955  PCP: Leonides Sake, MD  Admitted from: Home Discharged to: Home  Admit date: 01/24/2021 Discharge date: 01/27/2021  Recommendations for Outpatient Follow-up:    Follow-up Information     Hamrick, Lorin Mercy, MD. Schedule an appointment as soon as possible for a visit in 1 week(s).   Specialty: Family Medicine Why: To be seen with repeat labs (CBC & BMP).  Kindly reassess antihypertensive medications need during follow-up, these were held at discharge from the hospital. Contact information: [place] [address] [phone]         Maralyn Sago, MD. Schedule an appointment as soon as possible for a visit in 1 week(s).   Specialty: Oncology Why: Call as soon as able for an appointment. Contact information: [place] [id number] [phone]                  Home Health:  Home Health Orders (From admission, onward)     Start     Ordered   01/27/21 [place]  At discharge       Question:  To provide the following care/treatments  Answer:  PT   01/27/21 1023             Equipment/Devices:     Durable Medical Equipment  (From admission, onward)           Start     Ordered   01/27/21 1023  DME Walker  Once       Comments: With 2 wheels as per PT recommendations.  Question Answer Comment  Walker: With [place]   Patient needs a walker to treat with the following condition Physical deconditioning      01/27/21 1023             Discharge Condition: Improved and stable   Code Status: Full Code Diet recommendation:  Discharge Diet Orders (From admission, onward)     Start     Ordered   01/27/21 0000  Diet - low sodium heart healthy        01/27/21 1023   01/27/21 0000  Diet Carb Modified        01/27/21 1023             Discharge Diagnoses:  Principal Problem:   Hyperkalemia Active Problems:    Acute renal failure (ARF) (HCC)   Hypoxia   Transient hypotension   Macrocytic anemia   Metabolic acidosis with increased anion gap and accumulation of organic acids   Type 2 diabetes mellitus with complication, without long-term current use of insulin (HCC)   Hyperthyroidism   Transaminitis   History of DVT (deep vein thrombosis)   Brief Summary: 66 year old female with medical history significant for hypertension, DVT/PE on Eliquis, endometrial adenocarcinoma s/p chemotherapy, DM2 and hyperthyroidism, presented to the ED with complaints of nausea and vomiting.  She completed last of her 6 chemotherapy infusions of carboplatin and paclitaxel on 12/26/2020.  Since receiving chemotherapy, she has had nausea and vomiting.  Symptoms had usually improved with antiemetics.  However on day prior to admission, she started having intractable nausea and vomiting followed by confusion, malaise, palpitations and some difficulty breathing.  Also some bloody urine reported.  In the ED, hypotensive to 61/47 mmHg, hypoxic to 86% on room air and labs significant for hemoglobin 7.6, potassium 7, BUN 30, creatinine 3.55, anion gap 17, lactate 5.5.  PCCM consulted  due to hypotension and signed off.  Nephrology consulted due to AKI and hyperkalemia.  Treated with cocktail for hyperkalemia.  Admitted for acute renal failure, hyperkalemia, transient hypotension, acute metabolic encephalopathy, hypoxia.       Assessment & Plan:   Acute renal failure: Baseline creatinine 0.9-1.1.  Presented with creatinine of 3.35 and BUN of 31.  Likely related to dehydration from GI losses, Lasix, lisinopril use and hemodynamic/hypotension.  Renal ultrasound with mild right hydronephrosis.  Treated with IV fluids and blood transfusion.  Creatinine has improved to 1.81>1.5 >1.37 on day of discharge. Expect complete recovery.  Consider repeating renal ultrasound in a couple of weeks to ensure resolution of hydronephrosis. Held Lasix,  lisinopril at discharge.  To be reassessed during close outpatient follow-up with repeat BMP.  Clinically euvolemic.  Avoid nephrotoxins/counseled-discontinued ibuprofen.   Hyperkalemia: In the setting of acute renal failure and lisinopril use.  Treated in ED with IV fluids, insulin, dextrose, calcium gluconate, Lokelma, bicarbonate and Lasix.  Held lisinopril.  Resolved.  Anion gap metabolic acidosis/lactic acidosis: Secondary to acute renal failure and lactic acidosis.  Treatment as above.  Resolved.  Hypotension/hypertension: Presented with BP of 61/47.  Secondary to volume depletion and antihypertensives.  Resolved after volume resuscitation.  Holding antihypertensives (metoprolol, lisinopril and Lasix).  Low clinical suspicion for infectious etiology for overall presentation, empirically started broad-spectrum antibiotics were discontinued.  Blood pressures currently controlled off antihypertensives and these are held at time of discharge.  This is to be reassessed during close outpatient follow-up with PCP regarding resumption of some or all of them.   Subacute on chronic anemia: Presented with hemoglobin of 7.6.  This dropped to 6.7 in the absence of overt bleeding.  S/p 1 unit PRBC and hemoglobin has improved to 7.4 >7.3.  Per Care Everywhere, hemoglobin 8.7 on 12/14 and probably baseline hemoglobin is in the 8-9 range.  Chronic anemia likely related to chronic disease, malignancy and chemotherapy.  Acute anemia unclear etiology but no symptoms suggestive of overt bleeding.  Patient somewhat symptomatic of anemia, lightheadedness on sitting up at times, hence transfused second unit PRBC 1/14, patient consented.  Given macrocytosis, ordered anemia panel yesterday but for some reason was never done.  This can be pursued as outpatient.  Hemoglobin has improved to 9.3.  Follow CBC as outpatient   Thrombocytopenia: May be related to acute illness.  Improving/stable.  Follow CBC as outpatient   Acute  metabolic encephalopathy: Transient and resolved.  Likely related to dehydration, acute renal failure, hypotension and lactic acidosis.  Resolved.     Hypoxia: Acute.  Surgeon saturations 86% on room air.  Chest x-ray without acute abnormality.  Low index of suspicion for PE.  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  Resolved.    Diabetes mellitus type 2: Home Ozempic on hold.  Treated with SSI.  Good inpatient control.  Resume home dose of Ozempic at discharge   Endometrial adenocarcinoma on chemotherapy: Patient followed at oncology at [facility].  Last chemotherapy was almost a month ago.  Had right anterior chest Port-A-Cath.  Outpatient follow-up with oncology.  Patient states that she has completed course of chemotherapy.  She reports that she has an upcoming appointment on 1/18.   Hyperthyroidism: Chronic.  Patient on customized dose of methimazole as outpatient, continue.  TSH 8.388.  Free T4 0.63.  Clinically euthyroid.  Defer to outpatient providers to adjust dose of methimazole.   Transaminitis: Acute.  Resolved.   Anxiety/depression: Continue Prozac and  olanzapine   Hyperlipidemia: Continue Crestor   History of DVT/PE on chronic anticoagulation: Patient has been on Eliquis and had been noted to have concern for chronic pulmonary emboli as per CT scan of the abdomen and pelvis with contrast from 08/31/2020 noted in care everywhere. Held Eliquis temporarily.  In the absence of clinical overt bleeding, started IV heparin and then transition back to home dose of Eliquis on 1/14.  No overt bleeding.   Constipation: No BM in about a week's time.  Passing flatus.  Initiated bowel regimen and has had BM.  Continue laxatives at discharge.  Microscopic hematuria: Urine microscopy on 01/24/2021 showed >50 RBCs/hpf.  Could be related to her endometrial carcinoma source itself.  However not sure.  Outpatient evaluation as deemed necessary.  Could repeat urine microscopy in 2 to 3 weeks and  if persist then may need further assessment    Body mass index is 36.25 kg/m./Obesity      Consultants:   Nephrology   Procedures:   None    Discharge Instructions  Discharge Instructions     Call MD for:  difficulty breathing, headache or visual disturbances   Complete by: As directed    Call MD for:  extreme fatigue   Complete by: As directed    Call MD for:  persistant dizziness or light-headedness   Complete by: As directed    Call MD for:  persistant nausea and vomiting   Complete by: As directed    Call MD for:  severe uncontrolled pain   Complete by: As directed    Call MD for:  temperature >100.4   Complete by: As directed    Diet - low sodium heart healthy   Complete by: As directed    Diet Carb Modified   Complete by: As directed    Increase activity slowly   Complete by: As directed         Medication List     STOP taking these medications    furosemide 20 MG tablet Commonly known as: LASIX   ibuprofen 800 MG tablet Commonly known as: ADVIL   lisinopril 5 MG tablet Commonly known as: ZESTRIL   metoprolol succinate 25 MG 24 hr tablet Commonly known as: TOPROL-XL       TAKE these medications    Eliquis 5 MG Tabs tablet Generic drug: apixaban Take 5 mg by mouth 2 (two) times daily.   FLUoxetine 40 MG capsule Commonly known as: PROZAC Take 40 mg by mouth daily.   meclizine 25 MG tablet Commonly known as: ANTIVERT Take 50 mg by mouth 2 (two) times daily.   methimazole 10 MG tablet Commonly known as: TAPAZOLE Take by mouth.   OLANZapine 2.5 MG tablet Commonly known as: ZYPREXA Take 2.5 mg by mouth at bedtime.   Ozempic (1 MG/DOSE) 4 MG/3ML Sopn Generic drug: Semaglutide (1 MG/DOSE) Inject 1 mg into the skin once a week.   polyethylene glycol 17 g packet Commonly known as: MIRALAX / GLYCOLAX Take 17 g by mouth daily.   prochlorperazine 10 MG tablet Commonly known as: COMPAZINE Take 10 mg by mouth every 6 (six) hours as  needed for vomiting or nausea.   rosuvastatin 20 MG tablet Commonly known as: CRESTOR Take 1.5 tablets (30 mg total) by mouth daily. This is your prior dose that has not changed. What changed:  how much to take when to take this additional instructions   senna-docusate 8.6-50 MG tablet Commonly known as: Senokot-S Take 1 tablet by  mouth at bedtime as needed for mild constipation or moderate constipation.   solifenacin 10 MG tablet Commonly known as: VESICARE Take 10 mg by mouth daily.       Allergies  Allergen Reactions   Other Hives    Sausage   Atenolol Other (See Comments)    Syncope, bradycardia (occurred in the setting of AKI) Syncope, bradycardia (occurred in the setting of AKI)    Lithium Other (See Comments)    Interacts with other meds that she takes   Metformin Nausea Only   Codeine Itching and Rash   Diazepam Itching and Rash   Fexofenadine Itching and Rash      Procedures/Studies: US RENAL  Result Date: 01/24/2021 CLINICAL DATA:  Acute kidney injury EXAM: RENAL / URINARY TRACT ULTRASOUND COMPLETE COMPARISON:  None. FINDINGS: Right Kidney: Renal measurements: 9.8 x 5.3 x 5.1 cm = volume: 139.9 mL. Echogenicity within normal limits. No mass. Mild hydronephrosis visualized. Left Kidney: Renal measurements: 10.2 x 5.9 x 4.6 cm = volume: 143.8 mL. Echogenicity within normal limits. No mass or hydronephrosis visualized. Bladder: Appears normal for degree of bladder distention. Other: None. IMPRESSION: Mild right hydronephrosis. Electronically Signed   By: Macy Mis M.D.   On: 01/24/2021 10:28   DG Chest Portable 1 View  Result Date: 01/24/2021 CLINICAL DATA:  Shortness of breath and emesis EXAM: PORTABLE CHEST 1 VIEW COMPARISON:  08/31/2020 chest CT FINDINGS: Artifact from EKG leads. Enlarged heart size.  Negative mediastinal contours. Artifact from EKG leads. Porta catheter with tip at the right atrium. There is no edema, consolidation, effusion, or  pneumothorax. IMPRESSION: No evidence of acute disease. Electronically Signed   By: Jorje Guild M.D.   On: 01/24/2021 04:47      Subjective: "Can I go home".  Denies any complaints.  No dizziness, lightheadedness, chest pain, dyspnea, nausea or vomiting.  Tolerating diet.  Had a BM yesterday.  Per PT, declined home health PT yesterday.  Discharge Exam:  Vitals:   01/26/21 1613 01/26/21 1714 01/26/21 2118 01/27/21 0754  BP: 123/75 139/78 127/73 126/74  Pulse: 93 87 90 94  Resp: (!) 25 20 20 19   Temp: 98.8 F (37.1 C) 98.9 F (37.2 C) 99.3 F (37.4 C) 97.8 F (36.6 C)  TempSrc: Oral Oral Oral Oral  SpO2: 97% 96% 95%   Weight:      Height:        General exam: Middle-age female, moderately built and obese sitting up comfortably in bed without distress.  Appears to be in good spirits. Respiratory system: Clear to auscultation.  No increased work of breathing.  Right upper anterior chest Port-A-Cath. Cardiovascular system: S1 and S2 heard, RRR.  No JVD, murmurs or pedal edema.  No longer on telemetry. Gastrointestinal system: Abdomen is nondistended, soft and nontender. No organomegaly or masses felt. Normal bowel sounds heard. Central nervous system: Alert and oriented. No focal neurological deficits. Extremities: Symmetric 5 x 5 power. Skin: No rashes, lesions or ulcers Psychiatry: Judgement and insight appear normal. Mood & affect appropriate.     The results of significant diagnostics from this hospitalization (including imaging, microbiology, ancillary and laboratory) are listed below for reference.     Microbiology: Recent Results (from the past 240 hour(s))  Urine Culture     Status: Abnormal   Collection Time: 01/24/21  3:39 AM   Specimen: Urine, Clean Catch  Result Value Ref Range Status   Specimen Description URINE, CLEAN CATCH  Final   Special Requests  Final    NONE Performed at [facility], [place] [address], [place], [place] [address]    Culture  MULTIPLE SPECIES PRESENT, SUGGEST RECOLLECTION (A)  Final   Report Status 01/25/2021 FINAL  Final  Resp Panel by RT-PCR (Flu A&B, Covid) Nasopharyngeal Swab     Status: None   Collection Time: 01/24/21  3:40 AM   Specimen: Nasopharyngeal Swab; Nasopharyngeal(NP) swabs in vial transport medium  Result Value Ref Range Status   SARS Coronavirus 2 by RT PCR NEGATIVE NEGATIVE Final    Comment: (NOTE) SARS-CoV-2 target nucleic acids are NOT DETECTED.  The SARS-CoV-2 RNA is generally detectable in upper respiratory specimens during the acute phase of infection. The lowest concentration of SARS-CoV-2 viral copies this assay can detect is 138 copies/mL. A negative result does not preclude SARS-Cov-2 infection and should not be used as the sole basis for treatment or other patient management decisions. A negative result may occur with  improper specimen collection/handling, submission of specimen other than nasopharyngeal swab, presence of viral mutation(s) within the areas targeted by this assay, and inadequate number of viral copies(<138 copies/mL). A negative result must be combined with clinical observations, patient history, and epidemiological information. The expected result is Negative.  Fact Sheet for Patients:  EntrepreneurPulse.com.au  Fact Sheet for Healthcare Providers:  IncredibleEmployment.be  This test is no t yet approved or cleared by the [place] FDA and  has been authorized for detection and/or diagnosis of SARS-CoV-2 by FDA under an Emergency Use Authorization (EUA). This EUA will remain  in effect (meaning this test can be used) for the duration of the COVID-19 declaration under Section 564(b)(1) of the Act, 21 U.S.C.section 360bbb-3(b)(1), unless the authorization is terminated  or revoked sooner.       Influenza A by PCR NEGATIVE NEGATIVE Final   Influenza B by PCR NEGATIVE NEGATIVE Final    Comment: (NOTE) The Xpert  Xpress SARS-CoV-2/FLU/RSV plus assay is intended as an aid in the diagnosis of influenza from Nasopharyngeal swab specimens and should not be used as a sole basis for treatment. Nasal washings and aspirates are unacceptable for Xpert Xpress SARS-CoV-2/FLU/RSV testing.  Fact Sheet for Patients: EntrepreneurPulse.com.au  Fact Sheet for Healthcare Providers: IncredibleEmployment.be  This test is not yet approved or cleared by the [place] FDA and has been authorized for detection and/or diagnosis of SARS-CoV-2 by FDA under an Emergency Use Authorization (EUA). This EUA will remain in effect (meaning this test can be used) for the duration of the COVID-19 declaration under Section 564(b)(1) of the Act, 21 U.S.C. section 360bbb-3(b)(1), unless the authorization is terminated or revoked.  Performed at [facility], [place] [address], [place], [place] [address]   Culture, blood (routine x 2)     Status: None (Preliminary result)   Collection Time: 01/24/21  3:40 AM   Specimen: BLOOD LEFT HAND  Result Value Ref Range Status   Specimen Description BLOOD LEFT HAND  Final   Special Requests   Final    BOTTLES DRAWN AEROBIC AND ANAEROBIC Blood Culture results may not be optimal due to an excessive volume of blood received in culture bottles   Culture   Final    NO GROWTH 2 DAYS Performed at [facility], [place] [address] [place], [place] [address]    Report Status PENDING  Incomplete  Culture, blood (routine x 2)     Status: None (Preliminary result)   Collection Time: 01/24/21 10:15 AM   Specimen: BLOOD  Result Value Ref Range Status   Specimen Description BLOOD PORTA CATH  Final   Special Requests BAA Blood Culture adequate volume  Final   Culture   Final    NO GROWTH 2 DAYS Performed at [facility], [address] [place], [place] [address]    Report Status PENDING  Incomplete     Labs: CBC: Recent Labs  Lab  01/24/21 0350 01/24/21 1015 01/24/21 1515 01/25/21 0614 01/26/21 0229 01/27/21 0229  WBC 9.6  --   --  7.2 7.0 6.0  NEUTROABS 5.2  --   --   --   --   --   HGB 7.6* 6.7* 7.4* 7.4* 7.3* 9.3*  HCT 24.5* 20.4* 23.9* 22.2* 22.8* 27.7*  MCV 124.4*  --   --  114.4* 116.3* 106.5*  PLT 155  --   --  128* 131* 135*    Basic Metabolic Panel: Recent Labs  Lab 01/24/21 0350 01/24/21 0650 01/24/21 1015 01/25/21 0614 01/26/21 0229 01/27/21 0229  NA 139 142  --  141 143 144  K 7.0* 4.8  --  4.8 4.4 4.5  CL 102 105  --  112* 114* 115*  CO2 20* 20*  --  22 21* 19*  GLUCOSE 182* 212*  --  121* 114* 110*  BUN 30* 27*  --  15 11 9   CREATININE 3.55* 3.08*  --  1.81* 1.52* 1.37*  CALCIUM 9.3 8.6*  --  8.9 9.2 9.3  MG  --   --  1.9  --   --   --   PHOS  --  4.3  --   --   --   --     Liver Function Tests: Recent Labs  Lab 01/24/21 0350 01/24/21 0650 01/25/21 0614  AST 42*  --  21  ALT 21  --  17  ALKPHOS 54  --  46  BILITOT 0.5  --  0.7  PROT 6.8  --  5.4*  ALBUMIN 3.6 3.3* 2.9*    CBG: Recent Labs  Lab 01/26/21 1152 01/26/21 1714 01/26/21 2121 01/27/21 0641 01/27/21 0758  GLUCAP 107* 119* 128* 112* 122*    Thyroid function studies Recent Labs    01/24/21 1515  TSH 8.388*    Anemia work up Recent Labs    01/26/21 1030  RETICCTPCT 2.9    Urinalysis    Component Value Date/Time   COLORURINE YELLOW 01/24/2021 1015   APPEARANCEUR CLEAR 01/24/2021 1015   LABSPEC 1.015 01/24/2021 1015   PHURINE 7.0 01/24/2021 1015   GLUCOSEU 250 (A) 01/24/2021 1015   HGBUR LARGE (A) 01/24/2021 1015   BILIRUBINUR NEGATIVE 01/24/2021 1015   KETONESUR NEGATIVE 01/24/2021 1015   PROTEINUR 30 (A) 01/24/2021 1015   NITRITE NEGATIVE 01/24/2021 1015   LEUKOCYTESUR SMALL (A) 01/24/2021 1015      Time coordinating discharge: 35 minutes  SIGNED:  Vernell Leep, MD,  FACP, [facility], [facility], [facility] (Care Management Physician Certified). [facility]  To  contact the attending provider between 7A-7P or the covering provider during after hours 7P-7A, please log into the web site [email or web address] and access using universal [place] password for that web site. If you do not have the password, please call the hospital operator.

## 2021-01-27 NOTE — Progress Notes (Signed)
Nursing dc note  Patient and husband alert and oriented. Both verbalized understanding of dc instructions. All belongings and dc instructions given t patient

## 2021-01-27 NOTE — Discharge Instructions (Addendum)
Please get your medications reviewed and adjusted by your Primary MD. ° °Please request your Primary MD to go over all Hospital Tests and Procedure/Radiological results at the follow up, please get all Hospital records sent to your Prim MD by signing hospital release before you go home. ° °If you had Pneumonia of Lung problems at the Hospital: °Please get a 2 view Chest X ray done in 6-8 weeks after hospital discharge or sooner if instructed by your Primary MD. ° °If you have Congestive Heart Failure: °Please call your Cardiologist or Primary MD anytime you have any of the following symptoms:  °1) 3 pound weight gain in 24 hours or 5 pounds in 1 week  °2) shortness of breath, with or without a dry hacking cough  °3) swelling in the hands, feet or stomach  °4) if you have to sleep on extra pillows at night in order to breathe ° °Follow cardiac low salt diet and 1.5 lit/day fluid restriction. ° °If you have diabetes °Accuchecks 4 times/day, Once in AM empty stomach and then before each meal. °Log in all results and show them to your primary doctor at your next visit. °If any glucose reading is under 80 or above 300 call your primary MD immediately. ° °If you have Seizure/Convulsions/Epilepsy: °Please do not drive, operate heavy machinery, participate in activities at heights or participate in high speed sports until you have seen by Primary MD or a Neurologist and advised to do so again. ° °If you had Gastrointestinal Bleeding: °Please ask your Primary MD to check a complete blood count within one week of discharge or at your next visit. Your endoscopic/colonoscopic biopsies that are pending at the time of discharge, will also need to followed by your Primary MD. ° °Get Medicines reviewed and adjusted. °Please take all your medications with you for your next visit with your Primary MD ° °Please request your Primary MD to go over all hospital tests and procedure/radiological results at the follow up, please ask your  Primary MD to get all Hospital records sent to his/her office. ° °If you experience worsening of your admission symptoms, develop shortness of breath, life threatening emergency, suicidal or homicidal thoughts you must seek medical attention immediately by calling 911 or calling your MD immediately  if symptoms less severe. ° °You must read complete instructions/literature along with all the possible adverse reactions/side effects for all the Medicines you take and that have been prescribed to you. Take any new Medicines after you have completely understood and accpet all the possible adverse reactions/side effects.  ° °Do not drive or operate heavy machinery when taking Pain medications.  ° °Do not take more than prescribed Pain, Sleep and Anxiety Medications ° °Special Instructions: If you have smoked or chewed Tobacco  in the last 2 yrs please stop smoking, stop any regular Alcohol  and or any Recreational drug use. ° °Wear Seat belts while driving. ° °Please note °You were cared for by a hospitalist during your hospital stay. If you have any questions about your discharge medications or the care you received while you were in the hospital after you are discharged, you can call the unit and asked to speak with the hospitalist on call if the hospitalist that took care of you is not available. Once you are discharged, your primary care physician will handle any further medical issues. Please note that NO REFILLS for any discharge medications will be authorized once you are discharged, as it is imperative that you   return to your primary care physician (or establish a relationship with a primary care physician if you do not have one) for your aftercare needs so that they can reassess your need for medications and monitor your lab values.  You can reach the hospitalist office at phone [phone] or fax [phone]   If you do not have a primary care physician, you can call [phone] for a physician  referral.  Information on my medicine - ELIQUIS (apixaban)  This medication education was reviewed with me or my healthcare representative as part of my discharge preparation.  The pharmacist that spoke with me during my hospital stay was:    Why was Eliquis prescribed for you? Eliquis was prescribed to treat blood clots that may have been found in the veins of your legs (deep vein thrombosis) or in your lungs (pulmonary embolism) and to reduce the risk of them occurring again.  What do You need to know about Eliquis ? Continue Eliquis 5 mg tablet taken TWICE daily.  Eliquis may be taken with or without food.   Try to take the dose about the same time in the morning and in the evening. If you have difficulty swallowing the tablet whole please discuss with your pharmacist how to take the medication safely.  Take Eliquis exactly as prescribed and DO NOT stop taking Eliquis without talking to the doctor who prescribed the medication.  Stopping may increase your risk of developing a new blood clot.  Refill your prescription before you run out.  After discharge, you should have regular check-up appointments with your healthcare provider that is prescribing your Eliquis.    What do you do if you miss a dose? If a dose of ELIQUIS is not taken at the scheduled time, take it as soon as possible on the same day and twice-daily administration should be resumed. The dose should not be doubled to make up for a missed dose.  Important Safety Information A possible side effect of Eliquis is bleeding. You should call your healthcare provider right away if you experience any of the following: Bleeding from an injury or your nose that does not stop. Unusual colored urine (red or dark brown) or unusual colored stools (red or black). Unusual bruising for unknown reasons. A serious fall or if you hit your head (even if there is no bleeding).  Some medicines may interact with Eliquis and might  increase your risk of bleeding or clotting while on Eliquis. To help avoid this, consult your healthcare provider or pharmacist prior to using any new prescription or non-prescription medications, including herbals, vitamins, non-steroidal anti-inflammatory drugs (NSAIDs) and supplements.  This website has more information on Eliquis (apixaban): http://www.eliquis.com/eliquis/home

## 2021-01-29 DIAGNOSIS — C541 Malignant neoplasm of endometrium: Secondary | ICD-10-CM | POA: Diagnosis not present

## 2021-01-29 DIAGNOSIS — E1165 Type 2 diabetes mellitus with hyperglycemia: Secondary | ICD-10-CM | POA: Diagnosis not present

## 2021-01-29 LAB — CULTURE, BLOOD (ROUTINE X 2)
Culture: NO GROWTH
Culture: NO GROWTH
Special Requests: ADEQUATE

## 2021-01-30 ENCOUNTER — Other Ambulatory Visit: Payer: Self-pay | Admitting: Internal Medicine

## 2021-01-31 DIAGNOSIS — N179 Acute kidney failure, unspecified: Secondary | ICD-10-CM | POA: Diagnosis not present

## 2021-01-31 DIAGNOSIS — N189 Chronic kidney disease, unspecified: Secondary | ICD-10-CM | POA: Diagnosis not present

## 2021-01-31 DIAGNOSIS — R310 Gross hematuria: Secondary | ICD-10-CM | POA: Diagnosis not present

## 2021-01-31 DIAGNOSIS — D649 Anemia, unspecified: Secondary | ICD-10-CM | POA: Diagnosis not present

## 2021-01-31 DIAGNOSIS — Z6833 Body mass index (BMI) 33.0-33.9, adult: Secondary | ICD-10-CM | POA: Diagnosis not present

## 2021-01-31 DIAGNOSIS — I10 Essential (primary) hypertension: Secondary | ICD-10-CM | POA: Diagnosis not present

## 2021-02-01 DIAGNOSIS — Z87891 Personal history of nicotine dependence: Secondary | ICD-10-CM | POA: Diagnosis not present

## 2021-02-01 DIAGNOSIS — C541 Malignant neoplasm of endometrium: Secondary | ICD-10-CM | POA: Diagnosis not present

## 2021-02-01 DIAGNOSIS — I2699 Other pulmonary embolism without acute cor pulmonale: Secondary | ICD-10-CM | POA: Diagnosis not present

## 2021-02-01 DIAGNOSIS — R310 Gross hematuria: Secondary | ICD-10-CM | POA: Diagnosis not present

## 2021-02-01 DIAGNOSIS — Z8542 Personal history of malignant neoplasm of other parts of uterus: Secondary | ICD-10-CM | POA: Diagnosis not present

## 2021-02-01 DIAGNOSIS — Z7901 Long term (current) use of anticoagulants: Secondary | ICD-10-CM | POA: Diagnosis not present

## 2021-02-01 DIAGNOSIS — Z08 Encounter for follow-up examination after completed treatment for malignant neoplasm: Secondary | ICD-10-CM | POA: Diagnosis not present

## 2021-02-07 DIAGNOSIS — Z7901 Long term (current) use of anticoagulants: Secondary | ICD-10-CM | POA: Diagnosis not present

## 2021-02-07 DIAGNOSIS — D539 Nutritional anemia, unspecified: Secondary | ICD-10-CM | POA: Diagnosis not present

## 2021-02-07 DIAGNOSIS — C541 Malignant neoplasm of endometrium: Secondary | ICD-10-CM | POA: Diagnosis not present

## 2021-02-07 DIAGNOSIS — I82409 Acute embolism and thrombosis of unspecified deep veins of unspecified lower extremity: Secondary | ICD-10-CM | POA: Diagnosis not present

## 2021-03-18 DIAGNOSIS — Z1331 Encounter for screening for depression: Secondary | ICD-10-CM | POA: Diagnosis not present

## 2021-03-18 DIAGNOSIS — Z0000 Encounter for general adult medical examination without abnormal findings: Secondary | ICD-10-CM | POA: Diagnosis not present

## 2021-03-18 DIAGNOSIS — Z9181 History of falling: Secondary | ICD-10-CM | POA: Diagnosis not present

## 2021-03-18 DIAGNOSIS — Z6833 Body mass index (BMI) 33.0-33.9, adult: Secondary | ICD-10-CM | POA: Diagnosis not present

## 2021-03-18 DIAGNOSIS — E669 Obesity, unspecified: Secondary | ICD-10-CM | POA: Diagnosis not present

## 2021-03-18 DIAGNOSIS — E785 Hyperlipidemia, unspecified: Secondary | ICD-10-CM | POA: Diagnosis not present

## 2021-04-01 DIAGNOSIS — G43909 Migraine, unspecified, not intractable, without status migrainosus: Secondary | ICD-10-CM | POA: Diagnosis not present

## 2021-04-01 DIAGNOSIS — I700 Atherosclerosis of aorta: Secondary | ICD-10-CM | POA: Diagnosis not present

## 2021-04-01 DIAGNOSIS — E785 Hyperlipidemia, unspecified: Secondary | ICD-10-CM | POA: Diagnosis not present

## 2021-04-01 DIAGNOSIS — E1165 Type 2 diabetes mellitus with hyperglycemia: Secondary | ICD-10-CM | POA: Diagnosis not present

## 2021-04-01 DIAGNOSIS — I479 Paroxysmal tachycardia, unspecified: Secondary | ICD-10-CM | POA: Diagnosis not present

## 2021-04-01 DIAGNOSIS — E0590 Thyrotoxicosis, unspecified without thyrotoxic crisis or storm: Secondary | ICD-10-CM | POA: Diagnosis not present

## 2021-04-01 DIAGNOSIS — F319 Bipolar disorder, unspecified: Secondary | ICD-10-CM | POA: Diagnosis not present

## 2021-04-26 DIAGNOSIS — N950 Postmenopausal bleeding: Secondary | ICD-10-CM | POA: Diagnosis not present

## 2021-04-26 DIAGNOSIS — R319 Hematuria, unspecified: Secondary | ICD-10-CM | POA: Diagnosis not present

## 2021-04-26 DIAGNOSIS — R310 Gross hematuria: Secondary | ICD-10-CM | POA: Diagnosis not present

## 2021-05-03 DIAGNOSIS — Z87891 Personal history of nicotine dependence: Secondary | ICD-10-CM | POA: Diagnosis not present

## 2021-05-03 DIAGNOSIS — C541 Malignant neoplasm of endometrium: Secondary | ICD-10-CM | POA: Diagnosis not present

## 2021-05-03 DIAGNOSIS — N939 Abnormal uterine and vaginal bleeding, unspecified: Secondary | ICD-10-CM | POA: Diagnosis not present

## 2021-05-03 DIAGNOSIS — I2699 Other pulmonary embolism without acute cor pulmonale: Secondary | ICD-10-CM | POA: Diagnosis not present

## 2021-05-03 DIAGNOSIS — D539 Nutritional anemia, unspecified: Secondary | ICD-10-CM | POA: Diagnosis not present

## 2021-05-03 DIAGNOSIS — R319 Hematuria, unspecified: Secondary | ICD-10-CM | POA: Diagnosis not present

## 2021-05-03 DIAGNOSIS — Z8542 Personal history of malignant neoplasm of other parts of uterus: Secondary | ICD-10-CM | POA: Diagnosis not present

## 2021-05-03 DIAGNOSIS — Z08 Encounter for follow-up examination after completed treatment for malignant neoplasm: Secondary | ICD-10-CM | POA: Diagnosis not present

## 2021-05-12 DIAGNOSIS — I10 Essential (primary) hypertension: Secondary | ICD-10-CM | POA: Diagnosis not present

## 2021-05-12 DIAGNOSIS — E785 Hyperlipidemia, unspecified: Secondary | ICD-10-CM | POA: Diagnosis not present

## 2021-05-12 DIAGNOSIS — E1165 Type 2 diabetes mellitus with hyperglycemia: Secondary | ICD-10-CM | POA: Diagnosis not present

## 2021-06-21 DIAGNOSIS — C772 Secondary and unspecified malignant neoplasm of intra-abdominal lymph nodes: Secondary | ICD-10-CM | POA: Diagnosis not present

## 2021-06-21 DIAGNOSIS — C541 Malignant neoplasm of endometrium: Secondary | ICD-10-CM | POA: Diagnosis not present

## 2021-06-21 DIAGNOSIS — K148 Other diseases of tongue: Secondary | ICD-10-CM | POA: Diagnosis not present

## 2021-06-21 DIAGNOSIS — Z90710 Acquired absence of both cervix and uterus: Secondary | ICD-10-CM | POA: Diagnosis not present

## 2021-06-21 DIAGNOSIS — R590 Localized enlarged lymph nodes: Secondary | ICD-10-CM | POA: Diagnosis not present

## 2021-06-21 DIAGNOSIS — E119 Type 2 diabetes mellitus without complications: Secondary | ICD-10-CM | POA: Diagnosis not present

## 2021-06-28 DIAGNOSIS — I2699 Other pulmonary embolism without acute cor pulmonale: Secondary | ICD-10-CM | POA: Diagnosis not present

## 2021-06-28 DIAGNOSIS — C541 Malignant neoplasm of endometrium: Secondary | ICD-10-CM | POA: Diagnosis not present

## 2021-06-28 DIAGNOSIS — N939 Abnormal uterine and vaginal bleeding, unspecified: Secondary | ICD-10-CM | POA: Diagnosis not present

## 2021-06-28 DIAGNOSIS — Z08 Encounter for follow-up examination after completed treatment for malignant neoplasm: Secondary | ICD-10-CM | POA: Diagnosis not present

## 2021-06-28 DIAGNOSIS — Z8542 Personal history of malignant neoplasm of other parts of uterus: Secondary | ICD-10-CM | POA: Diagnosis not present

## 2021-07-03 DIAGNOSIS — I82409 Acute embolism and thrombosis of unspecified deep veins of unspecified lower extremity: Secondary | ICD-10-CM | POA: Diagnosis not present

## 2021-07-03 DIAGNOSIS — R531 Weakness: Secondary | ICD-10-CM | POA: Diagnosis not present

## 2021-07-03 DIAGNOSIS — Z7901 Long term (current) use of anticoagulants: Secondary | ICD-10-CM | POA: Diagnosis not present

## 2021-07-03 DIAGNOSIS — C541 Malignant neoplasm of endometrium: Secondary | ICD-10-CM | POA: Diagnosis not present

## 2021-07-09 DIAGNOSIS — C541 Malignant neoplasm of endometrium: Secondary | ICD-10-CM | POA: Diagnosis not present

## 2021-07-09 DIAGNOSIS — Z5112 Encounter for antineoplastic immunotherapy: Secondary | ICD-10-CM | POA: Diagnosis not present

## 2021-07-09 DIAGNOSIS — I10 Essential (primary) hypertension: Secondary | ICD-10-CM | POA: Diagnosis not present

## 2021-07-30 DIAGNOSIS — Z5112 Encounter for antineoplastic immunotherapy: Secondary | ICD-10-CM | POA: Diagnosis not present

## 2021-07-30 DIAGNOSIS — Z86718 Personal history of other venous thrombosis and embolism: Secondary | ICD-10-CM | POA: Diagnosis not present

## 2021-07-30 DIAGNOSIS — C541 Malignant neoplasm of endometrium: Secondary | ICD-10-CM | POA: Diagnosis not present

## 2021-07-30 DIAGNOSIS — C772 Secondary and unspecified malignant neoplasm of intra-abdominal lymph nodes: Secondary | ICD-10-CM | POA: Diagnosis not present

## 2021-07-30 DIAGNOSIS — Z79899 Other long term (current) drug therapy: Secondary | ICD-10-CM | POA: Diagnosis not present

## 2021-07-30 DIAGNOSIS — Z79633 Long term (current) use of mitotic inhibitor: Secondary | ICD-10-CM | POA: Diagnosis not present

## 2021-07-30 DIAGNOSIS — Z7901 Long term (current) use of anticoagulants: Secondary | ICD-10-CM | POA: Diagnosis not present

## 2021-07-30 DIAGNOSIS — I82409 Acute embolism and thrombosis of unspecified deep veins of unspecified lower extremity: Secondary | ICD-10-CM | POA: Diagnosis not present

## 2021-08-06 DIAGNOSIS — Z23 Encounter for immunization: Secondary | ICD-10-CM | POA: Diagnosis not present

## 2021-08-06 DIAGNOSIS — I479 Paroxysmal tachycardia, unspecified: Secondary | ICD-10-CM | POA: Diagnosis not present

## 2021-08-06 DIAGNOSIS — C781 Secondary malignant neoplasm of mediastinum: Secondary | ICD-10-CM | POA: Diagnosis not present

## 2021-08-06 DIAGNOSIS — I700 Atherosclerosis of aorta: Secondary | ICD-10-CM | POA: Diagnosis not present

## 2021-08-06 DIAGNOSIS — E785 Hyperlipidemia, unspecified: Secondary | ICD-10-CM | POA: Diagnosis not present

## 2021-08-06 DIAGNOSIS — E1165 Type 2 diabetes mellitus with hyperglycemia: Secondary | ICD-10-CM | POA: Diagnosis not present

## 2021-08-06 DIAGNOSIS — Z139 Encounter for screening, unspecified: Secondary | ICD-10-CM | POA: Diagnosis not present

## 2021-08-06 DIAGNOSIS — E0590 Thyrotoxicosis, unspecified without thyrotoxic crisis or storm: Secondary | ICD-10-CM | POA: Diagnosis not present

## 2021-08-06 DIAGNOSIS — G43909 Migraine, unspecified, not intractable, without status migrainosus: Secondary | ICD-10-CM | POA: Diagnosis not present

## 2021-08-06 DIAGNOSIS — F319 Bipolar disorder, unspecified: Secondary | ICD-10-CM | POA: Diagnosis not present

## 2021-08-20 DIAGNOSIS — C772 Secondary and unspecified malignant neoplasm of intra-abdominal lymph nodes: Secondary | ICD-10-CM | POA: Diagnosis not present

## 2021-08-20 DIAGNOSIS — I82409 Acute embolism and thrombosis of unspecified deep veins of unspecified lower extremity: Secondary | ICD-10-CM | POA: Diagnosis not present

## 2021-08-20 DIAGNOSIS — Z5112 Encounter for antineoplastic immunotherapy: Secondary | ICD-10-CM | POA: Diagnosis not present

## 2021-08-20 DIAGNOSIS — Z79899 Other long term (current) drug therapy: Secondary | ICD-10-CM | POA: Diagnosis not present

## 2021-08-20 DIAGNOSIS — C541 Malignant neoplasm of endometrium: Secondary | ICD-10-CM | POA: Diagnosis not present

## 2021-08-20 DIAGNOSIS — Z7901 Long term (current) use of anticoagulants: Secondary | ICD-10-CM | POA: Diagnosis not present

## 2021-09-10 DIAGNOSIS — Z79899 Other long term (current) drug therapy: Secondary | ICD-10-CM | POA: Diagnosis not present

## 2021-09-10 DIAGNOSIS — C772 Secondary and unspecified malignant neoplasm of intra-abdominal lymph nodes: Secondary | ICD-10-CM | POA: Diagnosis not present

## 2021-09-10 DIAGNOSIS — Z7901 Long term (current) use of anticoagulants: Secondary | ICD-10-CM | POA: Diagnosis not present

## 2021-09-10 DIAGNOSIS — C541 Malignant neoplasm of endometrium: Secondary | ICD-10-CM | POA: Diagnosis not present

## 2021-09-10 DIAGNOSIS — I82409 Acute embolism and thrombosis of unspecified deep veins of unspecified lower extremity: Secondary | ICD-10-CM | POA: Diagnosis not present

## 2021-09-10 DIAGNOSIS — R300 Dysuria: Secondary | ICD-10-CM | POA: Diagnosis not present

## 2021-09-10 DIAGNOSIS — R109 Unspecified abdominal pain: Secondary | ICD-10-CM | POA: Diagnosis not present

## 2021-09-10 DIAGNOSIS — Z5112 Encounter for antineoplastic immunotherapy: Secondary | ICD-10-CM | POA: Diagnosis not present

## 2021-09-10 DIAGNOSIS — Z95828 Presence of other vascular implants and grafts: Secondary | ICD-10-CM | POA: Diagnosis not present

## 2021-09-10 DIAGNOSIS — N3001 Acute cystitis with hematuria: Secondary | ICD-10-CM | POA: Diagnosis not present

## 2021-10-01 DIAGNOSIS — Z95828 Presence of other vascular implants and grafts: Secondary | ICD-10-CM | POA: Diagnosis not present

## 2021-10-01 DIAGNOSIS — Z5112 Encounter for antineoplastic immunotherapy: Secondary | ICD-10-CM | POA: Diagnosis not present

## 2021-10-01 DIAGNOSIS — Z79899 Other long term (current) drug therapy: Secondary | ICD-10-CM | POA: Diagnosis not present

## 2021-10-01 DIAGNOSIS — I10 Essential (primary) hypertension: Secondary | ICD-10-CM | POA: Diagnosis not present

## 2021-10-01 DIAGNOSIS — C541 Malignant neoplasm of endometrium: Secondary | ICD-10-CM | POA: Diagnosis not present

## 2021-10-01 DIAGNOSIS — C772 Secondary and unspecified malignant neoplasm of intra-abdominal lymph nodes: Secondary | ICD-10-CM | POA: Diagnosis not present

## 2021-10-04 DIAGNOSIS — I2699 Other pulmonary embolism without acute cor pulmonale: Secondary | ICD-10-CM | POA: Diagnosis not present

## 2021-10-04 DIAGNOSIS — Z7969 Long term (current) use of other immunomodulators and immunosuppressants: Secondary | ICD-10-CM | POA: Diagnosis not present

## 2021-10-04 DIAGNOSIS — C541 Malignant neoplasm of endometrium: Secondary | ICD-10-CM | POA: Diagnosis not present

## 2021-10-04 DIAGNOSIS — Z7901 Long term (current) use of anticoagulants: Secondary | ICD-10-CM | POA: Diagnosis not present

## 2021-10-04 DIAGNOSIS — Z79620 Long term (current) use of immunosuppressive biologic: Secondary | ICD-10-CM | POA: Diagnosis not present

## 2021-10-07 DIAGNOSIS — Z23 Encounter for immunization: Secondary | ICD-10-CM | POA: Diagnosis not present

## 2021-10-22 DIAGNOSIS — Z5112 Encounter for antineoplastic immunotherapy: Secondary | ICD-10-CM | POA: Diagnosis not present

## 2021-10-22 DIAGNOSIS — Z95828 Presence of other vascular implants and grafts: Secondary | ICD-10-CM | POA: Diagnosis not present

## 2021-10-22 DIAGNOSIS — Z7969 Long term (current) use of other immunomodulators and immunosuppressants: Secondary | ICD-10-CM | POA: Diagnosis not present

## 2021-10-22 DIAGNOSIS — Z9221 Personal history of antineoplastic chemotherapy: Secondary | ICD-10-CM | POA: Diagnosis not present

## 2021-10-22 DIAGNOSIS — Z7901 Long term (current) use of anticoagulants: Secondary | ICD-10-CM | POA: Diagnosis not present

## 2021-10-22 DIAGNOSIS — C772 Secondary and unspecified malignant neoplasm of intra-abdominal lymph nodes: Secondary | ICD-10-CM | POA: Diagnosis not present

## 2021-10-22 DIAGNOSIS — C541 Malignant neoplasm of endometrium: Secondary | ICD-10-CM | POA: Diagnosis not present

## 2021-10-22 DIAGNOSIS — Z86718 Personal history of other venous thrombosis and embolism: Secondary | ICD-10-CM | POA: Diagnosis not present

## 2021-11-12 DIAGNOSIS — E785 Hyperlipidemia, unspecified: Secondary | ICD-10-CM | POA: Diagnosis not present

## 2021-11-12 DIAGNOSIS — I10 Essential (primary) hypertension: Secondary | ICD-10-CM | POA: Diagnosis not present

## 2021-11-14 DIAGNOSIS — C541 Malignant neoplasm of endometrium: Secondary | ICD-10-CM | POA: Diagnosis not present

## 2021-11-14 DIAGNOSIS — E1165 Type 2 diabetes mellitus with hyperglycemia: Secondary | ICD-10-CM | POA: Diagnosis not present

## 2021-11-29 DIAGNOSIS — C541 Malignant neoplasm of endometrium: Secondary | ICD-10-CM | POA: Diagnosis not present

## 2021-12-02 DIAGNOSIS — Z9221 Personal history of antineoplastic chemotherapy: Secondary | ICD-10-CM | POA: Diagnosis not present

## 2021-12-02 DIAGNOSIS — Z86718 Personal history of other venous thrombosis and embolism: Secondary | ICD-10-CM | POA: Diagnosis not present

## 2021-12-02 DIAGNOSIS — Z79899 Other long term (current) drug therapy: Secondary | ICD-10-CM | POA: Diagnosis not present

## 2021-12-02 DIAGNOSIS — Z7901 Long term (current) use of anticoagulants: Secondary | ICD-10-CM | POA: Diagnosis not present

## 2021-12-02 DIAGNOSIS — C541 Malignant neoplasm of endometrium: Secondary | ICD-10-CM | POA: Diagnosis not present

## 2021-12-02 DIAGNOSIS — R300 Dysuria: Secondary | ICD-10-CM | POA: Diagnosis not present

## 2021-12-02 DIAGNOSIS — R319 Hematuria, unspecified: Secondary | ICD-10-CM | POA: Diagnosis not present

## 2021-12-09 DIAGNOSIS — C541 Malignant neoplasm of endometrium: Secondary | ICD-10-CM | POA: Diagnosis not present

## 2021-12-11 DIAGNOSIS — C541 Malignant neoplasm of endometrium: Secondary | ICD-10-CM | POA: Diagnosis not present

## 2021-12-13 DIAGNOSIS — C541 Malignant neoplasm of endometrium: Secondary | ICD-10-CM | POA: Diagnosis not present

## 2021-12-13 DIAGNOSIS — G43909 Migraine, unspecified, not intractable, without status migrainosus: Secondary | ICD-10-CM | POA: Diagnosis not present

## 2021-12-13 DIAGNOSIS — R809 Proteinuria, unspecified: Secondary | ICD-10-CM | POA: Diagnosis not present

## 2021-12-13 DIAGNOSIS — E0590 Thyrotoxicosis, unspecified without thyrotoxic crisis or storm: Secondary | ICD-10-CM | POA: Diagnosis not present

## 2021-12-13 DIAGNOSIS — E785 Hyperlipidemia, unspecified: Secondary | ICD-10-CM | POA: Diagnosis not present

## 2021-12-13 DIAGNOSIS — F319 Bipolar disorder, unspecified: Secondary | ICD-10-CM | POA: Diagnosis not present

## 2021-12-13 DIAGNOSIS — E1129 Type 2 diabetes mellitus with other diabetic kidney complication: Secondary | ICD-10-CM | POA: Diagnosis not present

## 2021-12-13 DIAGNOSIS — Z6837 Body mass index (BMI) 37.0-37.9, adult: Secondary | ICD-10-CM | POA: Diagnosis not present

## 2021-12-27 DIAGNOSIS — C541 Malignant neoplasm of endometrium: Secondary | ICD-10-CM | POA: Diagnosis not present

## 2022-01-03 DIAGNOSIS — Z79899 Other long term (current) drug therapy: Secondary | ICD-10-CM | POA: Diagnosis not present

## 2022-01-03 DIAGNOSIS — Z86718 Personal history of other venous thrombosis and embolism: Secondary | ICD-10-CM | POA: Diagnosis not present

## 2022-01-03 DIAGNOSIS — Z7901 Long term (current) use of anticoagulants: Secondary | ICD-10-CM | POA: Diagnosis not present

## 2022-01-03 DIAGNOSIS — C541 Malignant neoplasm of endometrium: Secondary | ICD-10-CM | POA: Diagnosis not present

## 2022-01-03 DIAGNOSIS — R319 Hematuria, unspecified: Secondary | ICD-10-CM | POA: Diagnosis not present

## 2022-01-03 DIAGNOSIS — Z79620 Long term (current) use of immunosuppressive biologic: Secondary | ICD-10-CM | POA: Diagnosis not present

## 2022-01-07 DIAGNOSIS — C541 Malignant neoplasm of endometrium: Secondary | ICD-10-CM | POA: Diagnosis not present

## 2022-01-07 DIAGNOSIS — I340 Nonrheumatic mitral (valve) insufficiency: Secondary | ICD-10-CM | POA: Diagnosis not present

## 2022-01-07 DIAGNOSIS — I071 Rheumatic tricuspid insufficiency: Secondary | ICD-10-CM | POA: Diagnosis not present

## 2022-01-07 DIAGNOSIS — Z01810 Encounter for preprocedural cardiovascular examination: Secondary | ICD-10-CM | POA: Diagnosis not present

## 2022-01-16 DIAGNOSIS — Z6837 Body mass index (BMI) 37.0-37.9, adult: Secondary | ICD-10-CM | POA: Diagnosis not present

## 2022-01-16 DIAGNOSIS — L299 Pruritus, unspecified: Secondary | ICD-10-CM | POA: Diagnosis not present

## 2022-01-16 DIAGNOSIS — T50905A Adverse effect of unspecified drugs, medicaments and biological substances, initial encounter: Secondary | ICD-10-CM | POA: Diagnosis not present

## 2022-01-16 DIAGNOSIS — H6122 Impacted cerumen, left ear: Secondary | ICD-10-CM | POA: Diagnosis not present

## 2022-01-16 DIAGNOSIS — E1165 Type 2 diabetes mellitus with hyperglycemia: Secondary | ICD-10-CM | POA: Diagnosis not present

## 2022-01-22 DIAGNOSIS — C541 Malignant neoplasm of endometrium: Secondary | ICD-10-CM | POA: Diagnosis not present

## 2022-01-22 DIAGNOSIS — Z7901 Long term (current) use of anticoagulants: Secondary | ICD-10-CM | POA: Diagnosis not present

## 2022-01-22 DIAGNOSIS — R319 Hematuria, unspecified: Secondary | ICD-10-CM | POA: Diagnosis not present

## 2022-01-22 DIAGNOSIS — R531 Weakness: Secondary | ICD-10-CM | POA: Diagnosis not present

## 2022-01-22 DIAGNOSIS — Z9221 Personal history of antineoplastic chemotherapy: Secondary | ICD-10-CM | POA: Diagnosis not present

## 2022-01-22 DIAGNOSIS — Z86718 Personal history of other venous thrombosis and embolism: Secondary | ICD-10-CM | POA: Diagnosis not present

## 2022-02-20 DIAGNOSIS — Z79811 Long term (current) use of aromatase inhibitors: Secondary | ICD-10-CM | POA: Diagnosis not present

## 2022-02-20 DIAGNOSIS — C541 Malignant neoplasm of endometrium: Secondary | ICD-10-CM | POA: Diagnosis not present

## 2022-02-20 DIAGNOSIS — Z7901 Long term (current) use of anticoagulants: Secondary | ICD-10-CM | POA: Diagnosis not present

## 2022-02-20 DIAGNOSIS — R319 Hematuria, unspecified: Secondary | ICD-10-CM | POA: Diagnosis not present

## 2022-02-20 DIAGNOSIS — I82409 Acute embolism and thrombosis of unspecified deep veins of unspecified lower extremity: Secondary | ICD-10-CM | POA: Diagnosis not present

## 2022-03-07 DIAGNOSIS — C541 Malignant neoplasm of endometrium: Secondary | ICD-10-CM | POA: Diagnosis not present

## 2022-03-31 DIAGNOSIS — Z79811 Long term (current) use of aromatase inhibitors: Secondary | ICD-10-CM | POA: Diagnosis not present

## 2022-03-31 DIAGNOSIS — R21 Rash and other nonspecific skin eruption: Secondary | ICD-10-CM | POA: Diagnosis not present

## 2022-03-31 DIAGNOSIS — C541 Malignant neoplasm of endometrium: Secondary | ICD-10-CM | POA: Diagnosis not present

## 2022-04-24 DIAGNOSIS — Z79899 Other long term (current) drug therapy: Secondary | ICD-10-CM | POA: Diagnosis not present

## 2022-04-24 DIAGNOSIS — C7951 Secondary malignant neoplasm of bone: Secondary | ICD-10-CM | POA: Diagnosis not present

## 2022-04-24 DIAGNOSIS — M533 Sacrococcygeal disorders, not elsewhere classified: Secondary | ICD-10-CM | POA: Diagnosis not present

## 2022-04-24 DIAGNOSIS — I700 Atherosclerosis of aorta: Secondary | ICD-10-CM | POA: Diagnosis not present

## 2022-04-24 DIAGNOSIS — C541 Malignant neoplasm of endometrium: Secondary | ICD-10-CM | POA: Diagnosis not present

## 2022-04-28 DIAGNOSIS — Z79811 Long term (current) use of aromatase inhibitors: Secondary | ICD-10-CM | POA: Diagnosis not present

## 2022-04-28 DIAGNOSIS — C7951 Secondary malignant neoplasm of bone: Secondary | ICD-10-CM | POA: Diagnosis not present

## 2022-04-28 DIAGNOSIS — C541 Malignant neoplasm of endometrium: Secondary | ICD-10-CM | POA: Diagnosis not present

## 2022-04-29 DIAGNOSIS — I479 Paroxysmal tachycardia, unspecified: Secondary | ICD-10-CM | POA: Diagnosis not present

## 2022-04-29 DIAGNOSIS — I700 Atherosclerosis of aorta: Secondary | ICD-10-CM | POA: Diagnosis not present

## 2022-04-29 DIAGNOSIS — G43909 Migraine, unspecified, not intractable, without status migrainosus: Secondary | ICD-10-CM | POA: Diagnosis not present

## 2022-04-29 DIAGNOSIS — R809 Proteinuria, unspecified: Secondary | ICD-10-CM | POA: Diagnosis not present

## 2022-04-29 DIAGNOSIS — E1129 Type 2 diabetes mellitus with other diabetic kidney complication: Secondary | ICD-10-CM | POA: Diagnosis not present

## 2022-04-29 DIAGNOSIS — E785 Hyperlipidemia, unspecified: Secondary | ICD-10-CM | POA: Diagnosis not present

## 2022-04-29 DIAGNOSIS — Z9181 History of falling: Secondary | ICD-10-CM | POA: Diagnosis not present

## 2022-04-29 DIAGNOSIS — F319 Bipolar disorder, unspecified: Secondary | ICD-10-CM | POA: Diagnosis not present

## 2022-04-29 DIAGNOSIS — E0590 Thyrotoxicosis, unspecified without thyrotoxic crisis or storm: Secondary | ICD-10-CM | POA: Diagnosis not present

## 2022-05-05 DIAGNOSIS — Z7983 Long term (current) use of bisphosphonates: Secondary | ICD-10-CM | POA: Diagnosis not present

## 2022-05-05 DIAGNOSIS — C7951 Secondary malignant neoplasm of bone: Secondary | ICD-10-CM | POA: Diagnosis not present

## 2022-05-05 DIAGNOSIS — Z5181 Encounter for therapeutic drug level monitoring: Secondary | ICD-10-CM | POA: Diagnosis not present

## 2022-05-05 DIAGNOSIS — C541 Malignant neoplasm of endometrium: Secondary | ICD-10-CM | POA: Diagnosis not present

## 2022-05-05 DIAGNOSIS — Z79811 Long term (current) use of aromatase inhibitors: Secondary | ICD-10-CM | POA: Diagnosis not present

## 2022-05-12 DIAGNOSIS — C541 Malignant neoplasm of endometrium: Secondary | ICD-10-CM | POA: Diagnosis not present

## 2022-05-13 DIAGNOSIS — E785 Hyperlipidemia, unspecified: Secondary | ICD-10-CM | POA: Diagnosis not present

## 2022-05-13 DIAGNOSIS — E1165 Type 2 diabetes mellitus with hyperglycemia: Secondary | ICD-10-CM | POA: Diagnosis not present

## 2022-05-13 DIAGNOSIS — I10 Essential (primary) hypertension: Secondary | ICD-10-CM | POA: Diagnosis not present

## 2022-05-19 DIAGNOSIS — C541 Malignant neoplasm of endometrium: Secondary | ICD-10-CM | POA: Diagnosis not present

## 2022-05-28 DIAGNOSIS — C541 Malignant neoplasm of endometrium: Secondary | ICD-10-CM | POA: Diagnosis not present

## 2022-05-28 DIAGNOSIS — Z79818 Long term (current) use of other agents affecting estrogen receptors and estrogen levels: Secondary | ICD-10-CM | POA: Diagnosis not present

## 2022-06-06 DIAGNOSIS — C541 Malignant neoplasm of endometrium: Secondary | ICD-10-CM | POA: Diagnosis not present

## 2022-06-11 DIAGNOSIS — C541 Malignant neoplasm of endometrium: Secondary | ICD-10-CM | POA: Diagnosis not present

## 2022-06-13 DIAGNOSIS — E1165 Type 2 diabetes mellitus with hyperglycemia: Secondary | ICD-10-CM | POA: Diagnosis not present

## 2022-06-13 DIAGNOSIS — I10 Essential (primary) hypertension: Secondary | ICD-10-CM | POA: Diagnosis not present

## 2022-06-13 DIAGNOSIS — E785 Hyperlipidemia, unspecified: Secondary | ICD-10-CM | POA: Diagnosis not present

## 2022-06-25 DIAGNOSIS — Z79818 Long term (current) use of other agents affecting estrogen receptors and estrogen levels: Secondary | ICD-10-CM | POA: Diagnosis not present

## 2022-06-25 DIAGNOSIS — C541 Malignant neoplasm of endometrium: Secondary | ICD-10-CM | POA: Diagnosis not present

## 2022-06-25 DIAGNOSIS — Z7983 Long term (current) use of bisphosphonates: Secondary | ICD-10-CM | POA: Diagnosis not present

## 2022-06-25 DIAGNOSIS — Z5181 Encounter for therapeutic drug level monitoring: Secondary | ICD-10-CM | POA: Diagnosis not present

## 2022-06-25 DIAGNOSIS — C7951 Secondary malignant neoplasm of bone: Secondary | ICD-10-CM | POA: Diagnosis not present

## 2022-07-07 DIAGNOSIS — E1136 Type 2 diabetes mellitus with diabetic cataract: Secondary | ICD-10-CM | POA: Diagnosis not present

## 2022-07-07 DIAGNOSIS — F319 Bipolar disorder, unspecified: Secondary | ICD-10-CM | POA: Diagnosis not present

## 2022-07-07 DIAGNOSIS — I479 Paroxysmal tachycardia, unspecified: Secondary | ICD-10-CM | POA: Diagnosis not present

## 2022-07-07 DIAGNOSIS — E7800 Pure hypercholesterolemia, unspecified: Secondary | ICD-10-CM | POA: Diagnosis not present

## 2022-07-07 DIAGNOSIS — C541 Malignant neoplasm of endometrium: Secondary | ICD-10-CM | POA: Diagnosis not present

## 2022-07-07 DIAGNOSIS — C7951 Secondary malignant neoplasm of bone: Secondary | ICD-10-CM | POA: Diagnosis not present

## 2022-07-07 DIAGNOSIS — I700 Atherosclerosis of aorta: Secondary | ICD-10-CM | POA: Diagnosis not present

## 2022-07-07 DIAGNOSIS — E1169 Type 2 diabetes mellitus with other specified complication: Secondary | ICD-10-CM | POA: Diagnosis not present

## 2022-07-07 DIAGNOSIS — D630 Anemia in neoplastic disease: Secondary | ICD-10-CM | POA: Diagnosis not present

## 2022-07-09 DIAGNOSIS — E1136 Type 2 diabetes mellitus with diabetic cataract: Secondary | ICD-10-CM | POA: Diagnosis not present

## 2022-07-09 DIAGNOSIS — I700 Atherosclerosis of aorta: Secondary | ICD-10-CM | POA: Diagnosis not present

## 2022-07-09 DIAGNOSIS — F319 Bipolar disorder, unspecified: Secondary | ICD-10-CM | POA: Diagnosis not present

## 2022-07-09 DIAGNOSIS — C7951 Secondary malignant neoplasm of bone: Secondary | ICD-10-CM | POA: Diagnosis not present

## 2022-07-09 DIAGNOSIS — C541 Malignant neoplasm of endometrium: Secondary | ICD-10-CM | POA: Diagnosis not present

## 2022-07-09 DIAGNOSIS — D630 Anemia in neoplastic disease: Secondary | ICD-10-CM | POA: Diagnosis not present

## 2022-07-09 DIAGNOSIS — I479 Paroxysmal tachycardia, unspecified: Secondary | ICD-10-CM | POA: Diagnosis not present

## 2022-07-09 DIAGNOSIS — E7800 Pure hypercholesterolemia, unspecified: Secondary | ICD-10-CM | POA: Diagnosis not present

## 2022-07-09 DIAGNOSIS — E1169 Type 2 diabetes mellitus with other specified complication: Secondary | ICD-10-CM | POA: Diagnosis not present

## 2022-07-10 DIAGNOSIS — S7292XA Unspecified fracture of left femur, initial encounter for closed fracture: Secondary | ICD-10-CM | POA: Diagnosis not present

## 2022-07-10 DIAGNOSIS — Z471 Aftercare following joint replacement surgery: Secondary | ICD-10-CM | POA: Diagnosis not present

## 2022-07-10 DIAGNOSIS — K648 Other hemorrhoids: Secondary | ICD-10-CM | POA: Diagnosis not present

## 2022-07-10 DIAGNOSIS — E119 Type 2 diabetes mellitus without complications: Secondary | ICD-10-CM | POA: Diagnosis not present

## 2022-07-10 DIAGNOSIS — K921 Melena: Secondary | ICD-10-CM | POA: Diagnosis not present

## 2022-07-10 DIAGNOSIS — G8918 Other acute postprocedural pain: Secondary | ICD-10-CM | POA: Diagnosis not present

## 2022-07-10 DIAGNOSIS — I878 Other specified disorders of veins: Secondary | ICD-10-CM | POA: Diagnosis not present

## 2022-07-10 DIAGNOSIS — Z96642 Presence of left artificial hip joint: Secondary | ICD-10-CM | POA: Diagnosis not present

## 2022-07-10 DIAGNOSIS — M47816 Spondylosis without myelopathy or radiculopathy, lumbar region: Secondary | ICD-10-CM | POA: Diagnosis not present

## 2022-07-10 DIAGNOSIS — I959 Hypotension, unspecified: Secondary | ICD-10-CM | POA: Diagnosis not present

## 2022-07-10 DIAGNOSIS — Z743 Need for continuous supervision: Secondary | ICD-10-CM | POA: Diagnosis not present

## 2022-07-10 DIAGNOSIS — S72002A Fracture of unspecified part of neck of left femur, initial encounter for closed fracture: Secondary | ICD-10-CM | POA: Diagnosis not present

## 2022-07-10 DIAGNOSIS — M25552 Pain in left hip: Secondary | ICD-10-CM | POA: Diagnosis not present

## 2022-07-10 DIAGNOSIS — S72012A Unspecified intracapsular fracture of left femur, initial encounter for closed fracture: Secondary | ICD-10-CM | POA: Diagnosis not present

## 2022-07-10 DIAGNOSIS — C785 Secondary malignant neoplasm of large intestine and rectum: Secondary | ICD-10-CM | POA: Diagnosis not present

## 2022-07-10 DIAGNOSIS — R58 Hemorrhage, not elsewhere classified: Secondary | ICD-10-CM | POA: Diagnosis not present

## 2022-07-10 DIAGNOSIS — C187 Malignant neoplasm of sigmoid colon: Secondary | ICD-10-CM | POA: Diagnosis not present

## 2022-07-10 DIAGNOSIS — M160 Bilateral primary osteoarthritis of hip: Secondary | ICD-10-CM | POA: Diagnosis not present

## 2022-07-10 DIAGNOSIS — Z043 Encounter for examination and observation following other accident: Secondary | ICD-10-CM | POA: Diagnosis not present

## 2022-07-10 DIAGNOSIS — D62 Acute posthemorrhagic anemia: Secondary | ICD-10-CM | POA: Diagnosis not present

## 2022-07-10 DIAGNOSIS — I10 Essential (primary) hypertension: Secondary | ICD-10-CM | POA: Diagnosis not present

## 2022-07-10 DIAGNOSIS — K922 Gastrointestinal hemorrhage, unspecified: Secondary | ICD-10-CM | POA: Diagnosis not present

## 2022-07-10 DIAGNOSIS — F319 Bipolar disorder, unspecified: Secondary | ICD-10-CM | POA: Diagnosis not present

## 2022-07-10 DIAGNOSIS — I2782 Chronic pulmonary embolism: Secondary | ICD-10-CM | POA: Diagnosis not present

## 2022-07-10 DIAGNOSIS — R059 Cough, unspecified: Secondary | ICD-10-CM | POA: Diagnosis not present

## 2022-07-10 DIAGNOSIS — C541 Malignant neoplasm of endometrium: Secondary | ICD-10-CM | POA: Diagnosis not present

## 2022-07-10 DIAGNOSIS — C7951 Secondary malignant neoplasm of bone: Secondary | ICD-10-CM | POA: Diagnosis not present

## 2022-07-10 DIAGNOSIS — K6389 Other specified diseases of intestine: Secondary | ICD-10-CM | POA: Diagnosis not present

## 2022-07-16 DIAGNOSIS — F319 Bipolar disorder, unspecified: Secondary | ICD-10-CM | POA: Diagnosis not present

## 2022-07-16 DIAGNOSIS — I479 Paroxysmal tachycardia, unspecified: Secondary | ICD-10-CM | POA: Diagnosis not present

## 2022-07-16 DIAGNOSIS — I700 Atherosclerosis of aorta: Secondary | ICD-10-CM | POA: Diagnosis not present

## 2022-07-16 DIAGNOSIS — D630 Anemia in neoplastic disease: Secondary | ICD-10-CM | POA: Diagnosis not present

## 2022-07-16 DIAGNOSIS — E7800 Pure hypercholesterolemia, unspecified: Secondary | ICD-10-CM | POA: Diagnosis not present

## 2022-07-16 DIAGNOSIS — C7951 Secondary malignant neoplasm of bone: Secondary | ICD-10-CM | POA: Diagnosis not present

## 2022-07-16 DIAGNOSIS — E1136 Type 2 diabetes mellitus with diabetic cataract: Secondary | ICD-10-CM | POA: Diagnosis not present

## 2022-07-16 DIAGNOSIS — E1169 Type 2 diabetes mellitus with other specified complication: Secondary | ICD-10-CM | POA: Diagnosis not present

## 2022-07-16 DIAGNOSIS — C541 Malignant neoplasm of endometrium: Secondary | ICD-10-CM | POA: Diagnosis not present

## 2022-07-17 DIAGNOSIS — E7800 Pure hypercholesterolemia, unspecified: Secondary | ICD-10-CM | POA: Diagnosis not present

## 2022-07-17 DIAGNOSIS — D630 Anemia in neoplastic disease: Secondary | ICD-10-CM | POA: Diagnosis not present

## 2022-07-17 DIAGNOSIS — E1136 Type 2 diabetes mellitus with diabetic cataract: Secondary | ICD-10-CM | POA: Diagnosis not present

## 2022-07-17 DIAGNOSIS — I479 Paroxysmal tachycardia, unspecified: Secondary | ICD-10-CM | POA: Diagnosis not present

## 2022-07-17 DIAGNOSIS — I700 Atherosclerosis of aorta: Secondary | ICD-10-CM | POA: Diagnosis not present

## 2022-07-17 DIAGNOSIS — E1169 Type 2 diabetes mellitus with other specified complication: Secondary | ICD-10-CM | POA: Diagnosis not present

## 2022-07-17 DIAGNOSIS — C541 Malignant neoplasm of endometrium: Secondary | ICD-10-CM | POA: Diagnosis not present

## 2022-07-17 DIAGNOSIS — F319 Bipolar disorder, unspecified: Secondary | ICD-10-CM | POA: Diagnosis not present

## 2022-07-17 DIAGNOSIS — C7951 Secondary malignant neoplasm of bone: Secondary | ICD-10-CM | POA: Diagnosis not present

## 2022-07-23 DIAGNOSIS — E7800 Pure hypercholesterolemia, unspecified: Secondary | ICD-10-CM | POA: Diagnosis not present

## 2022-07-23 DIAGNOSIS — C541 Malignant neoplasm of endometrium: Secondary | ICD-10-CM | POA: Diagnosis not present

## 2022-07-23 DIAGNOSIS — E1136 Type 2 diabetes mellitus with diabetic cataract: Secondary | ICD-10-CM | POA: Diagnosis not present

## 2022-07-23 DIAGNOSIS — F319 Bipolar disorder, unspecified: Secondary | ICD-10-CM | POA: Diagnosis not present

## 2022-07-23 DIAGNOSIS — C7951 Secondary malignant neoplasm of bone: Secondary | ICD-10-CM | POA: Diagnosis not present

## 2022-07-23 DIAGNOSIS — I700 Atherosclerosis of aorta: Secondary | ICD-10-CM | POA: Diagnosis not present

## 2022-07-23 DIAGNOSIS — D630 Anemia in neoplastic disease: Secondary | ICD-10-CM | POA: Diagnosis not present

## 2022-07-23 DIAGNOSIS — E1169 Type 2 diabetes mellitus with other specified complication: Secondary | ICD-10-CM | POA: Diagnosis not present

## 2022-07-23 DIAGNOSIS — I479 Paroxysmal tachycardia, unspecified: Secondary | ICD-10-CM | POA: Diagnosis not present

## 2022-07-24 DIAGNOSIS — I479 Paroxysmal tachycardia, unspecified: Secondary | ICD-10-CM | POA: Diagnosis not present

## 2022-07-24 DIAGNOSIS — F319 Bipolar disorder, unspecified: Secondary | ICD-10-CM | POA: Diagnosis not present

## 2022-07-24 DIAGNOSIS — C541 Malignant neoplasm of endometrium: Secondary | ICD-10-CM | POA: Diagnosis not present

## 2022-07-24 DIAGNOSIS — E7800 Pure hypercholesterolemia, unspecified: Secondary | ICD-10-CM | POA: Diagnosis not present

## 2022-07-24 DIAGNOSIS — D630 Anemia in neoplastic disease: Secondary | ICD-10-CM | POA: Diagnosis not present

## 2022-07-24 DIAGNOSIS — E1169 Type 2 diabetes mellitus with other specified complication: Secondary | ICD-10-CM | POA: Diagnosis not present

## 2022-07-24 DIAGNOSIS — E1136 Type 2 diabetes mellitus with diabetic cataract: Secondary | ICD-10-CM | POA: Diagnosis not present

## 2022-07-24 DIAGNOSIS — I700 Atherosclerosis of aorta: Secondary | ICD-10-CM | POA: Diagnosis not present

## 2022-07-24 DIAGNOSIS — C7951 Secondary malignant neoplasm of bone: Secondary | ICD-10-CM | POA: Diagnosis not present

## 2022-07-25 DIAGNOSIS — M25552 Pain in left hip: Secondary | ICD-10-CM | POA: Diagnosis not present

## 2022-07-29 DIAGNOSIS — F319 Bipolar disorder, unspecified: Secondary | ICD-10-CM | POA: Diagnosis not present

## 2022-07-29 DIAGNOSIS — D630 Anemia in neoplastic disease: Secondary | ICD-10-CM | POA: Diagnosis not present

## 2022-07-29 DIAGNOSIS — C7951 Secondary malignant neoplasm of bone: Secondary | ICD-10-CM | POA: Diagnosis not present

## 2022-07-29 DIAGNOSIS — E1169 Type 2 diabetes mellitus with other specified complication: Secondary | ICD-10-CM | POA: Diagnosis not present

## 2022-07-29 DIAGNOSIS — I479 Paroxysmal tachycardia, unspecified: Secondary | ICD-10-CM | POA: Diagnosis not present

## 2022-07-29 DIAGNOSIS — E1136 Type 2 diabetes mellitus with diabetic cataract: Secondary | ICD-10-CM | POA: Diagnosis not present

## 2022-07-29 DIAGNOSIS — E7800 Pure hypercholesterolemia, unspecified: Secondary | ICD-10-CM | POA: Diagnosis not present

## 2022-07-29 DIAGNOSIS — C541 Malignant neoplasm of endometrium: Secondary | ICD-10-CM | POA: Diagnosis not present

## 2022-07-29 DIAGNOSIS — I700 Atherosclerosis of aorta: Secondary | ICD-10-CM | POA: Diagnosis not present

## 2022-08-01 DIAGNOSIS — C541 Malignant neoplasm of endometrium: Secondary | ICD-10-CM | POA: Diagnosis not present

## 2022-10-14 DEATH — deceased

## 2022-11-02 IMAGING — US US RENAL
1 series · 14 of 25 positions shown · non-contrast
Comparison: None.

CLINICAL DATA: Acute kidney injury

EXAM:
RENAL / URINARY TRACT ULTRASOUND COMPLETE

[Series 1: us renal · 14 of 50 slices shown]
[im 1/50]
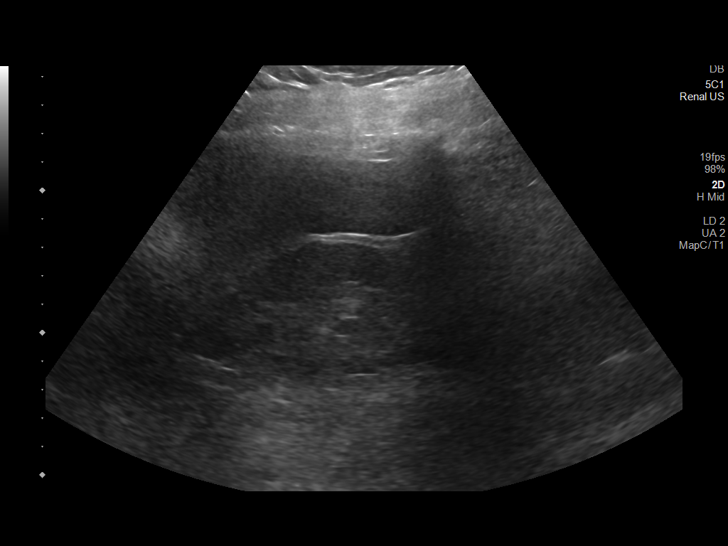
[im 5/50]
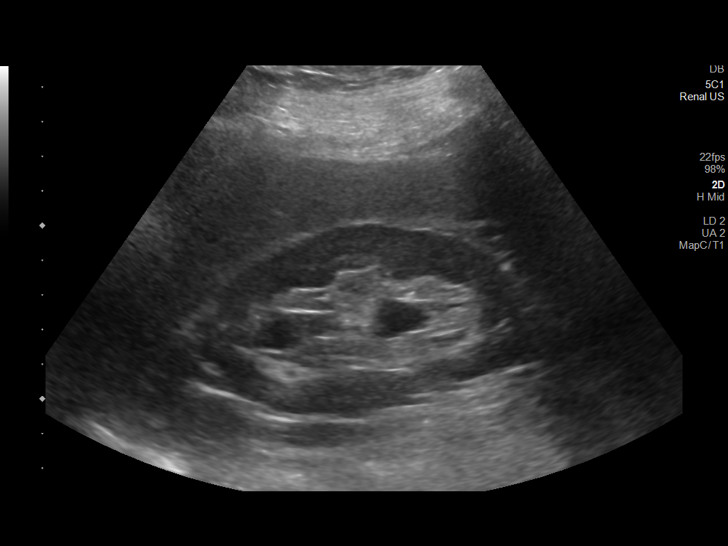
[im 9/50]
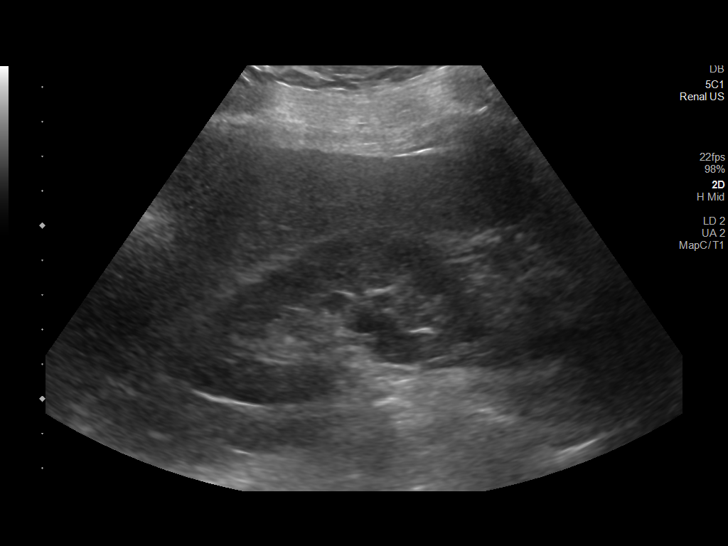
[im 13/50]
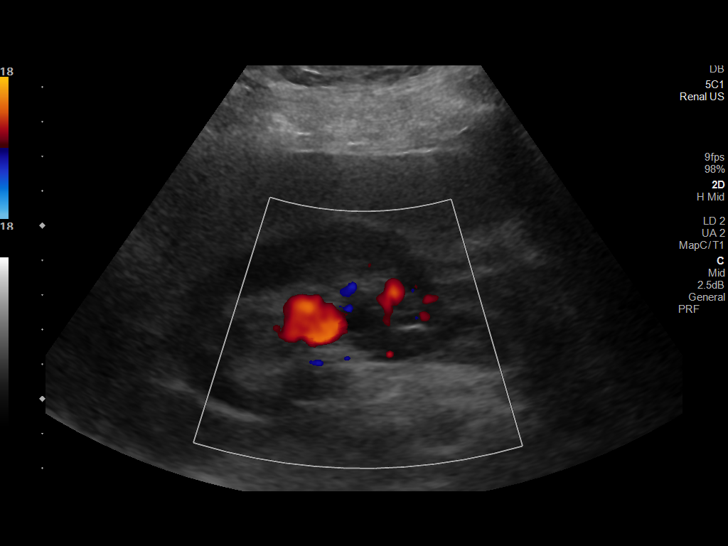
[im 17/50]
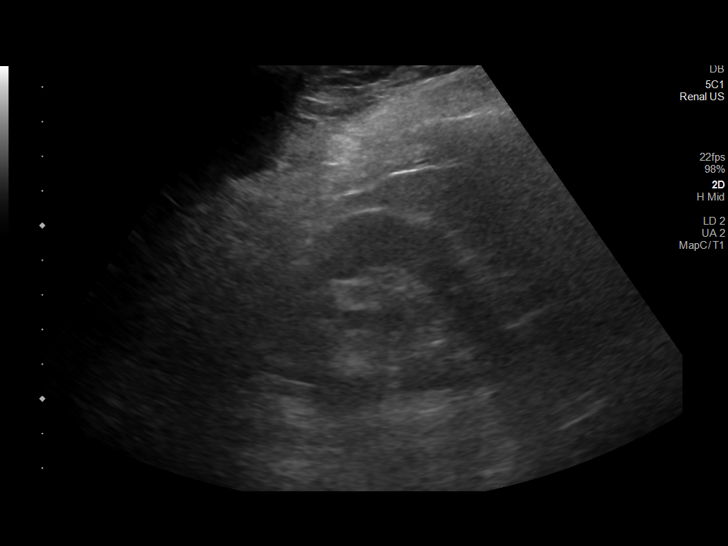
[im 19/50]
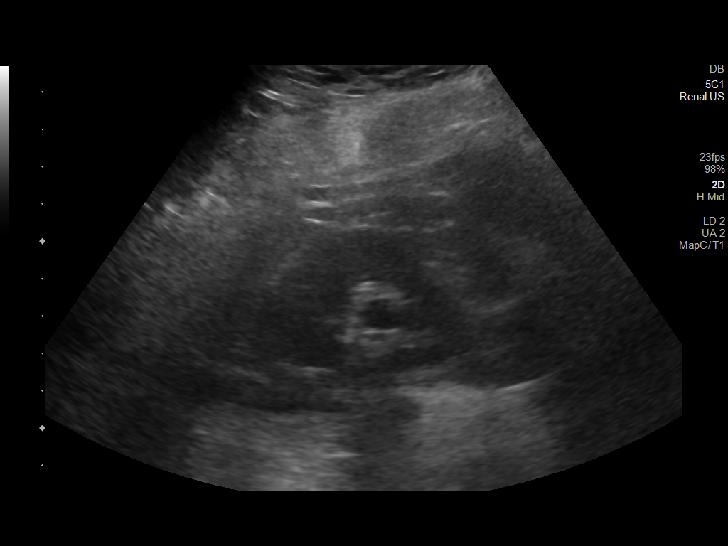
[im 23/50]
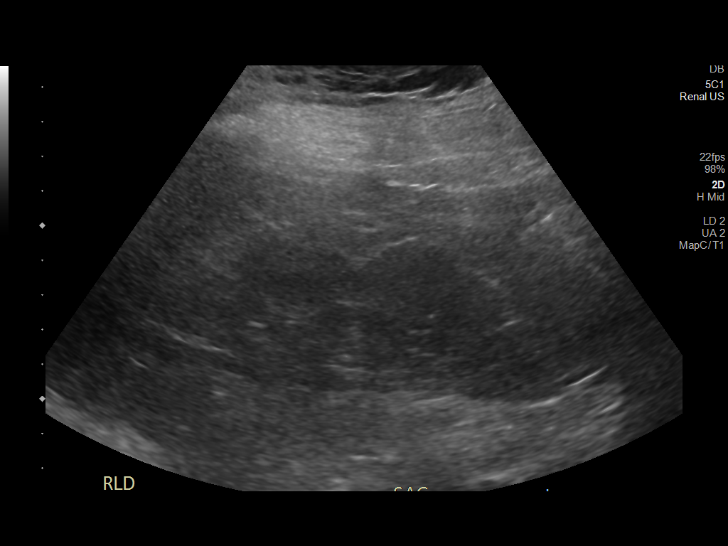
[im 27/50]
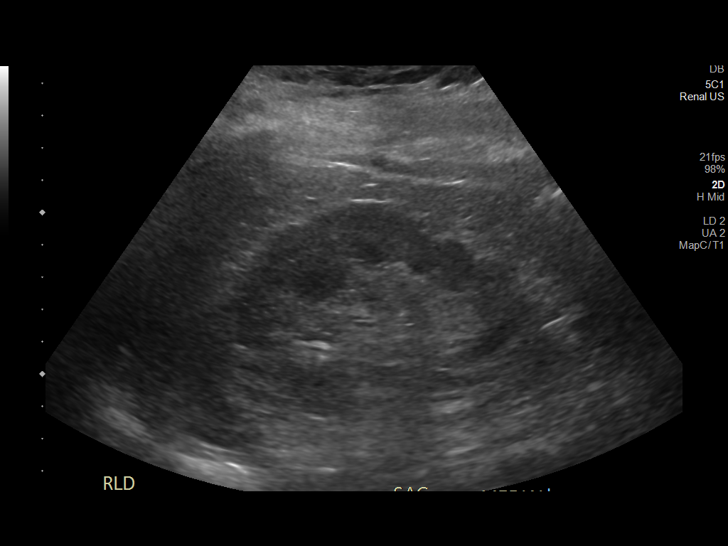
[im 31/50]
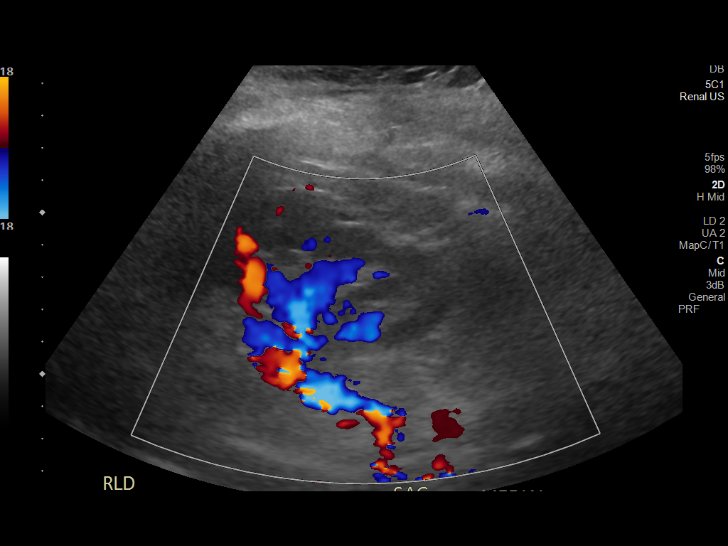
[im 33/50]
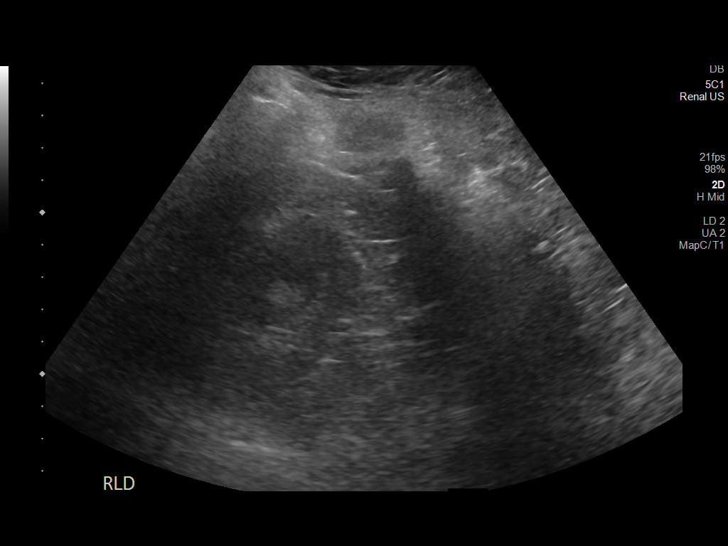
[im 37/50]
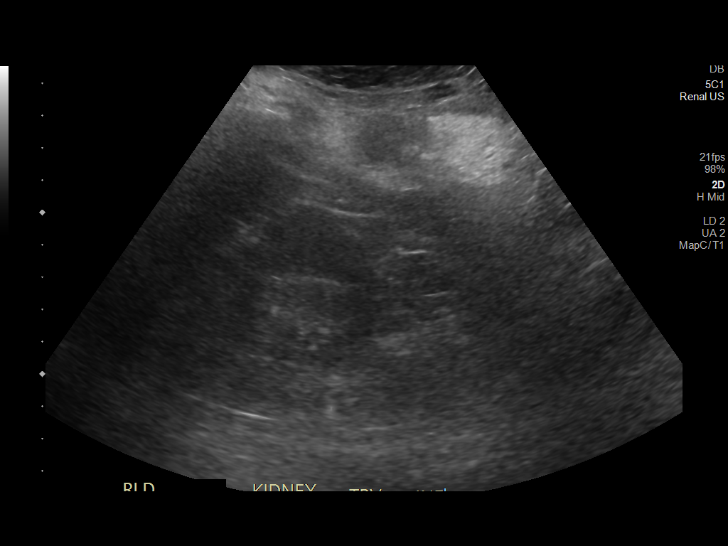
[im 41/50]
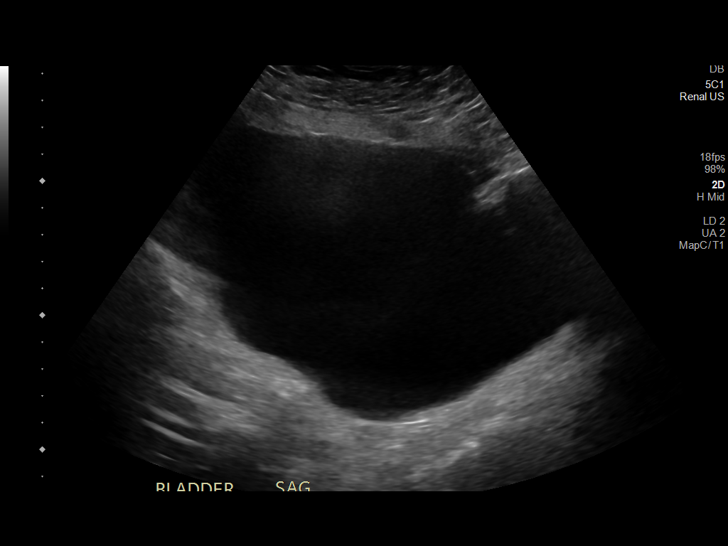
[im 45/50]
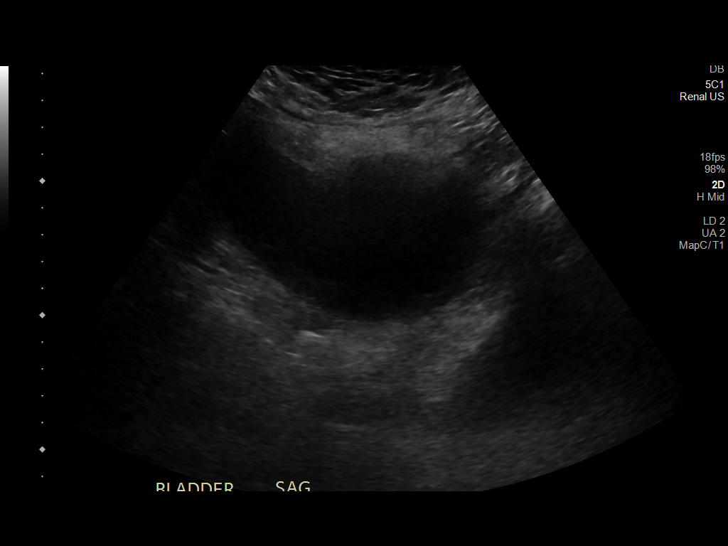
[im 50/50]
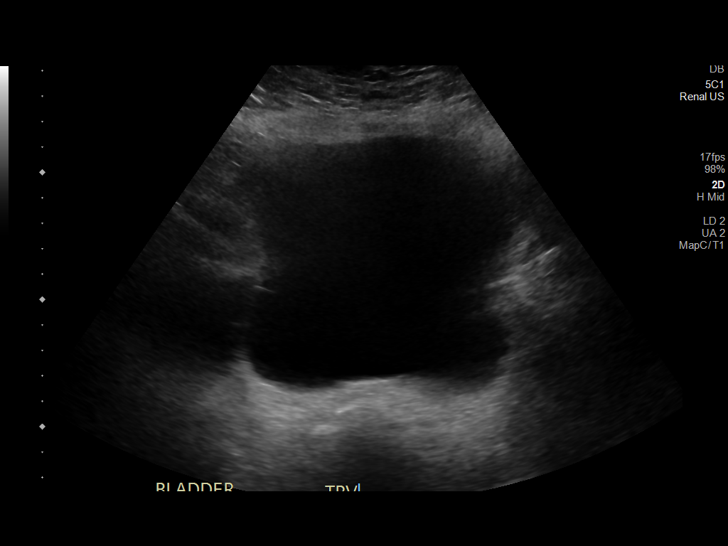

[14 of 25 positions shown; findings below may reference images not displayed]

FINDINGS: Right Kidney:

Renal measurements: 9.8 x 5.3 x 5.1 cm = volume: 139.9 mL.
Echogenicity within normal limits. No mass. Mild hydronephrosis
visualized.

Left Kidney:

Renal measurements: 10.2 x 5.9 x 4.6 cm = volume: 143.8 mL.
Echogenicity within normal limits. No mass or hydronephrosis
visualized.

Bladder:

Appears normal for degree of bladder distention.

Other:

None.
IMPRESSION: Mild right hydronephrosis.
